# Patient Record
Sex: Male | Born: 1970 | Race: Asian | Hispanic: No | Marital: Married | Smoking: Never smoker
Health system: Southern US, Community
[De-identification: ages and names within clinical notes are randomized; demographics above are authoritative.]

## PROBLEM LIST (undated history)

## (undated) DIAGNOSIS — J309 Allergic rhinitis, unspecified: Secondary | ICD-10-CM

## (undated) DIAGNOSIS — D509 Iron deficiency anemia, unspecified: Secondary | ICD-10-CM

## (undated) DIAGNOSIS — E785 Hyperlipidemia, unspecified: Secondary | ICD-10-CM

## (undated) HISTORY — DX: Hyperlipidemia, unspecified: E78.5

## (undated) HISTORY — DX: Iron deficiency anemia, unspecified: D50.9

## (undated) HISTORY — PX: COLONOSCOPY: SHX174

## (undated) HISTORY — DX: Allergic rhinitis, unspecified: J30.9

---

## 2000-10-20 ENCOUNTER — Emergency Department (HOSPITAL_COMMUNITY): Admission: EM | Admit: 2000-10-20 | Discharge: 2000-10-20 | Payer: Self-pay | Admitting: Emergency Medicine

## 2012-01-02 ENCOUNTER — Ambulatory Visit (INDEPENDENT_AMBULATORY_CARE_PROVIDER_SITE_OTHER): Payer: BC Managed Care – PPO | Admitting: Physician Assistant

## 2012-01-02 VITALS — BP 131/85 | HR 79 | Temp 98.0°F | Resp 18 | Ht 64.5 in | Wt 162.0 lb

## 2012-01-02 DIAGNOSIS — J069 Acute upper respiratory infection, unspecified: Secondary | ICD-10-CM

## 2012-01-02 MED ORDER — AZITHROMYCIN 250 MG PO TABS: ORAL_TABLET | ORAL | Status: AC

## 2012-01-02 MED ORDER — PROMETHAZINE-DM 6.25-15 MG/5ML PO SYRP: 5.0000 mL | ORAL_SOLUTION | Freq: Every day | ORAL | Status: AC

## 2012-01-02 NOTE — Progress Notes (Signed)
  Subjective:    Patient ID: Jared Galvan, male    DOB: [birth year], 41 y.o.   MRN: [id number]  HPI Mr. Jared Galvan c/o URI symptoms for 1 week.  Cough getting worse.  ST, Head congestion, Cough.  No F/C. Has not taken anything for this.   Review of Systems  HENT: Positive for congestion, sore throat, rhinorrhea and sinus pressure.   Respiratory: Positive for cough.   Musculoskeletal: Positive for myalgias.  Neurological: Positive for headaches.       Objective:   Physical Exam  Constitutional: He appears well-developed and well-nourished.  HENT:  Right Ear: Tympanic membrane normal.  Left Ear: Tympanic membrane normal.  Nose: Mucosal edema and rhinorrhea present.  Mouth/Throat: Posterior oropharyngeal erythema present.  Cardiovascular: Normal rate and regular rhythm.   Pulmonary/Chest: Effort normal and breath sounds normal.  Lymphadenopathy:    He has no cervical adenopathy.          Assessment & Plan:  URI  Zpack, phenergan DM and Mucinex DM otc

## 2012-05-13 ENCOUNTER — Encounter: Payer: Self-pay | Admitting: Physician Assistant

## 2012-05-13 ENCOUNTER — Other Ambulatory Visit: Payer: Self-pay | Admitting: Physician Assistant

## 2012-05-13 ENCOUNTER — Ambulatory Visit (INDEPENDENT_AMBULATORY_CARE_PROVIDER_SITE_OTHER): Payer: BC Managed Care – PPO | Admitting: Physician Assistant

## 2012-05-13 VITALS — BP 122/82 | HR 67 | Temp 98.1°F | Resp 16 | Ht 64.5 in | Wt 155.0 lb

## 2012-05-13 DIAGNOSIS — Z0000 Encounter for general adult medical examination without abnormal findings: Secondary | ICD-10-CM

## 2012-05-13 LAB — CBC
HCT: 42.4 % (ref 39.0–52.0)
Hemoglobin: 15 g/dL (ref 13.0–17.0)
MCV: 72.1 fL — ABNORMAL LOW (ref 78.0–100.0)
RBC: 5.88 MIL/uL — ABNORMAL HIGH (ref 4.22–5.81)
WBC: 7.3 10*3/uL (ref 4.0–10.5)

## 2012-05-13 LAB — COMPREHENSIVE METABOLIC PANEL
ALT: 32 U/L (ref 0–53)
AST: 23 U/L (ref 0–37)
Albumin: 5 g/dL (ref 3.5–5.2)
Alkaline Phosphatase: 53 U/L (ref 39–117)
BUN: 17 mg/dL (ref 6–23)
Calcium: 10.1 mg/dL (ref 8.4–10.5)
Chloride: 103 mEq/L (ref 96–112)
Potassium: 4 mEq/L (ref 3.5–5.3)
Sodium: 139 mEq/L (ref 135–145)
Total Protein: 7.8 g/dL (ref 6.0–8.3)

## 2012-05-13 LAB — POCT UA - MICROSCOPIC ONLY
Bacteria, U Microscopic: NEGATIVE
Mucus, UA: NEGATIVE
Yeast, UA: NEGATIVE

## 2012-05-13 LAB — POCT URINALYSIS DIPSTICK
Bilirubin, UA: NEGATIVE
Blood, UA: NEGATIVE
Glucose, UA: NEGATIVE
Nitrite, UA: NEGATIVE

## 2012-05-13 LAB — LIPID PANEL
HDL: 41 mg/dL (ref >39)
LDL Cholesterol: 122 mg/dL — ABNORMAL HIGH (ref 0–99)

## 2012-05-13 NOTE — Progress Notes (Signed)
Patient ID: Jared Galvan MRN: [id number], DOB: Jan 24, 1971 41 y.o. Date of Encounter: 05/13/2012, 1:02 PM  Primary Physician: No primary provider on file.  Chief Complaint: Physical (CPE)  HPI: 41 y.o. y/o male with history noted below here for CPE. Doing well. No issues/complaints. From [place]. Has been in the [place] 18 years. Last went to [place] 5 years ago to visit his father. Planning to go there again the following year. Generally healthy. Eats a healthy diet. Does get some exercise in, but not as much as he used to as he is busier with work now. Mother passed away in [place] with uterine cancer. No other known family history of cancers. Lives with his wife and daughter, both healthy.   Review of Systems: Consitutional: No fever, chills, fatigue, night sweats, lymphadenopathy, or weight changes. Eyes: No visual changes, eye redness, or discharge. ENT/Mouth: Ears: No otalgia, tinnitus, hearing loss, discharge. Nose: No congestion, rhinorrhea, sinus pain, or epistaxis. Throat: No sore throat, post nasal drip, or teeth pain. Cardiovascular: No CP, palpitations, diaphoresis, DOE, edema, orthopnea, PND. Respiratory: No cough, hemoptysis, SOB, or wheezing. Gastrointestinal: No anorexia, dysphagia, reflux, pain, nausea, vomiting, hematemesis, diarrhea, constipation, BRBPR, or melena. Genitourinary: No dysuria, frequency, urgency, hematuria, incontinence, nocturia, decreased urinary stream, discharge, impotence, or testicular pain/masses. Musculoskeletal: No decreased ROM, myalgias, stiffness, joint swelling, or weakness. Skin: No rash, erythema, lesion changes, pain, warmth, jaundice, or pruritis. Neurological: No headache, dizziness, syncope, seizures, tremors, memory loss, coordination problems, or paresthesias. Psychological: No anxiety, depression, hallucinations, SI/HI. Endocrine: No fatigue, polydipsia, polyphagia, polyuria, or known diabetes. All other systems were reviewed and are otherwise  negative.  No past medical history on file.   No past surgical history on file.  Home Meds:  Prior to Admission medications   Not on File    Allergies: No Known Allergies  History   Social History  . Marital Status: Single    Spouse Name: N/A    Number of Children: N/A  . Years of Education: N/A   Occupational History  . Not on file.   Social History Main Topics  . Smoking status: Never Smoker   . Smokeless tobacco: Not on file  . Alcohol Use: Not on file  . Drug Use: Not on file  . Sexually Active: Not on file   Other Topics Concern  . Not on file   Social History Narrative  . No narrative on file    No family history on file.  Physical Exam: There were no vitals taken for this visit.  General: Well developed, well nourished, in no acute distress. HEENT: Normocephalic, atraumatic. Conjunctiva pink, sclera non-icteric. Pupils 2 mm constricting to 1 mm, round, regular, and equally reactive to light and accomodation. EOMI. Internal auditory canal clear. TMs with good cone of light and without pathology. Nasal mucosa pink. Nares are without discharge. No sinus tenderness. Oral mucosa pink. Dentition normal. Pharynx without exudate.   Neck: Supple. Trachea midline. No thyromegaly. Full ROM. No lymphadenopathy. Lungs: Clear to auscultation bilaterally without wheezes, rales, or rhonchi. Breathing is of normal effort and unlabored. Cardiovascular: RRR with S1 S2. No murmurs, rubs, or gallops appreciated. Distal pulses 2+ symmetrically. No carotid or abdominal bruits. Abdomen: Soft, non-tender, non-distended with normoactive bowel sounds. No hepatosplenomegaly or masses. No rebound/guarding. No CVA tenderness. Without hernias.  Genitourinary: Circumcised male. No penile lesions. Testes descended bilaterally, and smooth without tenderness or masses.  Musculoskeletal: Full range of motion and 5/5 strength throughout. Without swelling, atrophy, tenderness, crepitus, or warmth.  Extremities without clubbing, cyanosis, or edema. Calves supple. Skin: Warm and moist without erythema, ecchymosis, wounds, or rash. Neuro: A+Ox3. CN II-XII grossly intact. Moves all extremities spontaneously. Full sensation throughout. Normal gait. DTR 2+ throughout upper and lower extremities. Finger to nose intact. Psych:  Responds to questions appropriately with a normal affect.   Studies:  Results for orders placed in visit on 05/13/12  POCT UA - MICROSCOPIC ONLY      Component Value Range   WBC, Ur, HPF, POC 0-1     RBC, urine, microscopic neg     Bacteria, U Microscopic neg     Mucus, UA neg     Epithelial cells, urine per micros 0-1     Crystals, Ur, HPF, POC neg     Casts, Ur, LPF, POC neg     Yeast, UA neg    POCT URINALYSIS DIPSTICK      Component Value Range   Color, UA yellow     Clarity, UA clear     Glucose, UA neg     Bilirubin, UA neg     Ketones, UA neg     Spec Grav, UA >=1.030     Blood, UA neg     pH, UA 6.0     Protein, UA neg     Urobilinogen, UA 0.2     Nitrite, UA neg     Leukocytes, UA Negative      CBC, CMP, Lipid, TSH all pending. Patient is fasting. Assessment/Plan:  41 y.o. y/o male here for CPE -Healthy male physical -Await labs -Healthy diet and exercise -Follow up pending labs -Anticipatory guidance   Signed, Eula Listen, PA-C 05/13/2012 1:02 PM

## 2012-05-14 LAB — T3: T3, Total: 124.9 ng/dL (ref 80.0–204.0)

## 2012-05-14 LAB — T4, FREE: Free T4: 1.57 ng/dL (ref 0.80–1.80)

## 2012-11-20 ENCOUNTER — Ambulatory Visit (INDEPENDENT_AMBULATORY_CARE_PROVIDER_SITE_OTHER): Payer: BC Managed Care – PPO | Admitting: Physician Assistant

## 2012-11-20 VITALS — BP 121/78 | HR 76 | Temp 97.9°F | Resp 16 | Ht 65.0 in | Wt 158.0 lb

## 2012-11-20 DIAGNOSIS — J029 Acute pharyngitis, unspecified: Secondary | ICD-10-CM

## 2012-11-20 DIAGNOSIS — R059 Cough, unspecified: Secondary | ICD-10-CM

## 2012-11-20 DIAGNOSIS — J069 Acute upper respiratory infection, unspecified: Secondary | ICD-10-CM

## 2012-11-20 LAB — POCT RAPID STREP A (OFFICE): Rapid Strep A Screen: NEGATIVE

## 2012-11-20 MED ORDER — IPRATROPIUM BROMIDE 0.03 % NA SOLN: 2.0000 | Freq: Two times a day (BID) | NASAL | Status: DC

## 2012-11-20 MED ORDER — GUAIFENESIN ER 1200 MG PO TB12: 1.0000 | ORAL_TABLET | Freq: Two times a day (BID) | ORAL | Status: DC | PRN

## 2012-11-20 MED ORDER — BENZONATATE 100 MG PO CAPS: 100.0000 mg | ORAL_CAPSULE | Freq: Three times a day (TID) | ORAL | Status: DC | PRN

## 2012-11-20 NOTE — Progress Notes (Signed)
  Subjective:    Patient ID: Jared Galvan, male    DOB: Apr 22, 1971, 42 y.o.   MRN: [id number]  HPI This 42 y.o. male presents for evaluation of 3-4 days of respiratory symptoms.  Cough, runny nose (white discharge), sore throat.  No fever, chills, GI/GU symptoms.  Gets this every year in February.   History reviewed. No pertinent past medical history.  History reviewed. No pertinent past surgical history.  Prior to Admission medications   Not on File    No Known Allergies  History   Social History  . Marital Status: Married    Spouse Name: N/A    Number of Children: 1  . Years of Education: 12   Occupational History  . chemical    Social History Main Topics  . Smoking status: Never Smoker   . Smokeless tobacco: Never Used  . Alcohol Use: No  . Drug Use: No  . Sexually Active: Yes -- Male partner(s)   Other Topics Concern  . Not on file   Social History Narrative   From [place].  Came to the [place] in 1992. Lives with his wife and their daughter.  His brother also lives with them.    Family History  Problem Relation Age of Onset  . Cancer Mother 65    uterine     Review of Systems As above.    Objective:   Physical Exam  Blood pressure 121/78, pulse 76, temperature 97.9 F (36.6 C), resp. rate 16, height 5\' 5"  (1.651 m), weight 158 lb (71.668 kg), SpO2 97.00%. Body mass index is 26.29 kg/(m^2). Well-developed, well nourished [place] man who is awake, alert and oriented, in NAD. HEENT: [place]/AT, PERRL, EOMI.  Sclera and conjunctiva are clear.  EAC are patent, TMs are normal in appearance. Nasal mucosa is pink and moist. OP is clear. Neck: supple, non-tender, no lymphadenopathy, thyromegaly. Heart: RRR, no murmur Lungs: normal effort, CTA Extremities: no cyanosis, clubbing or edema. Skin: warm and dry without rash. Psychologic: good mood and appropriate affect, normal speech and behavior.   Results for orders placed in visit on 11/20/12  POCT RAPID STREP A  (OFFICE)      Result Value Range   Rapid Strep A Screen Negative  Negative       Assessment & Plan:  Viral URI with cough - Plan: ipratropium (ATROVENT) 0.03 % nasal spray, Guaifenesin (MUCINEX MAXIMUM STRENGTH) 1200 MG TB12  Acute pharyngitis - Plan: POCT rapid strep A  Cough - Plan: benzonatate (TESSALON) 100 MG capsule

## 2012-11-20 NOTE — Patient Instructions (Signed)
Get plenty of rest and drink at least 64 ounces of water daily. 

## 2013-01-14 ENCOUNTER — Ambulatory Visit (INDEPENDENT_AMBULATORY_CARE_PROVIDER_SITE_OTHER): Payer: BC Managed Care – PPO | Admitting: Physician Assistant

## 2013-01-14 VITALS — BP 130/90 | HR 82 | Temp 98.8°F | Resp 16 | Ht 65.0 in | Wt 163.6 lb

## 2013-01-14 DIAGNOSIS — J019 Acute sinusitis, unspecified: Secondary | ICD-10-CM

## 2013-01-14 DIAGNOSIS — J309 Allergic rhinitis, unspecified: Secondary | ICD-10-CM

## 2013-01-14 DIAGNOSIS — R05 Cough: Secondary | ICD-10-CM

## 2013-01-14 DIAGNOSIS — R0981 Nasal congestion: Secondary | ICD-10-CM

## 2013-01-14 DIAGNOSIS — J3489 Other specified disorders of nose and nasal sinuses: Secondary | ICD-10-CM

## 2013-01-14 DIAGNOSIS — R059 Cough, unspecified: Secondary | ICD-10-CM

## 2013-01-14 MED ORDER — AMOXICILLIN 875 MG PO TABS: 875.0000 mg | ORAL_TABLET | Freq: Two times a day (BID) | ORAL | Status: DC

## 2013-01-14 MED ORDER — BENZONATATE 100 MG PO CAPS: 100.0000 mg | ORAL_CAPSULE | Freq: Three times a day (TID) | ORAL | Status: DC | PRN

## 2013-01-14 NOTE — Progress Notes (Signed)
  Subjective:    Patient ID: Jared Galvan, male    DOB: March 22, 1971, 42 y.o.   MRN: [id number]  HPI 42 year old male presents with 1 week history of cough, nasal congestion, sore throat, left sided sinus pressure, and PND. Does have a history of environmental allergies this time every year.  Has been taking OTC Zyrtec x 2 doses but does not think it has helped much.  No history of prescription medications for his allergies.  Denies fever, chills, nausea, vomiting, headache, dizziness, otalgia, SOB, wheezing, or hemoptysis.  Cough is not productive.    Patient is otherwise healthy with no other concerns today.     Review of Systems  Constitutional: Negative for fever and chills.  HENT: Positive for congestion, sore throat, rhinorrhea and postnasal drip. Negative for ear pain, trouble swallowing and neck stiffness.   Respiratory: Positive for cough. Negative for shortness of breath and wheezing.   Cardiovascular: Negative for chest pain.  Gastrointestinal: Negative for nausea, vomiting and abdominal pain.  Neurological: Negative for dizziness and headaches.       Objective:   Physical Exam  Constitutional: He is oriented to person, place, and time. He appears well-developed and well-nourished.  HENT:  Head: Normocephalic and atraumatic.  Right Ear: Hearing, tympanic membrane, external ear and ear canal normal.  Left Ear: Hearing, tympanic membrane, external ear and ear canal normal.  Mouth/Throat: Uvula is midline, oropharynx is clear and moist and mucous membranes are normal. No oropharyngeal exudate (clear postnasal drainage), posterior oropharyngeal edema, posterior oropharyngeal erythema or tonsillar abscesses.  Eyes: Conjunctivae are normal.  Neck: Normal range of motion. Neck supple.  Cardiovascular: Normal rate, regular rhythm and normal heart sounds.   Pulmonary/Chest: Effort normal and breath sounds normal.  Lymphadenopathy:    He has no cervical adenopathy.  Neurological: He is  alert and oriented to person, place, and time.  Psychiatric: He has a normal mood and affect. His behavior is normal. Judgment and thought content normal.          Assessment & Plan:  Acute sinusitis, unspecified - Plan: amoxicillin (AMOXIL) 875 MG tablet  Cough - Plan: benzonatate (TESSALON) 100 MG capsule  Nasal congestion  Allergic rhinitis  Patient Instructions  Take Zyrtec daily in the morning. May need to continue this medication through until summer.  Benadryl 25-50 mg at bedtime.  Start Atrovent nasal spray twice daily (has leftover rx at home from last visit) Amoxicillin 875 mg twice daily x 7 days Follow up if symptoms worsen or fail to improve.

## 2013-01-14 NOTE — Patient Instructions (Signed)
Take Zyrtec daily in the morning  Benadryl 25-50 mg at bedtime.  Start Atrovent nasal spray twice daily Amoxicillin 875 mg twice daily x 7 days

## 2013-01-16 ENCOUNTER — Encounter: Payer: Self-pay | Admitting: Family Medicine

## 2013-07-26 ENCOUNTER — Ambulatory Visit (INDEPENDENT_AMBULATORY_CARE_PROVIDER_SITE_OTHER): Payer: BC Managed Care – PPO | Admitting: Family Medicine

## 2013-07-26 VITALS — BP 120/78 | HR 96 | Temp 98.3°F | Resp 18 | Ht 65.0 in | Wt 159.0 lb

## 2013-07-26 DIAGNOSIS — J209 Acute bronchitis, unspecified: Secondary | ICD-10-CM

## 2013-07-26 DIAGNOSIS — R05 Cough: Secondary | ICD-10-CM

## 2013-07-26 DIAGNOSIS — R059 Cough, unspecified: Secondary | ICD-10-CM

## 2013-07-26 MED ORDER — CEFDINIR 300 MG PO CAPS: 600.0000 mg | ORAL_CAPSULE | Freq: Every day | ORAL | Status: DC

## 2013-07-26 MED ORDER — BENZONATATE 100 MG PO CAPS: 100.0000 mg | ORAL_CAPSULE | Freq: Three times a day (TID) | ORAL | Status: DC | PRN

## 2013-07-26 MED ORDER — HYDROCODONE-HOMATROPINE 5-1.5 MG/5ML PO SYRP: 5.0000 mL | ORAL_SOLUTION | ORAL | Status: DC | PRN

## 2013-07-26 NOTE — Progress Notes (Signed)
Subjective:  42 year old [place] man who is here with a history of having been sick for a week with a respiratory tract infection. He's had a cough productive of yellow mucus which is continued to persist. His abdominal wall hurts from coughing. He coughs more at nighttime. He works as a Char[employer] fundraiser. He does not smoke he has been getting this several times over the last year.  Objective: TMs are normal. He does have a left otalgia when he coughs. His throat is clear. Neck supple without significant nodes. Chest is clear to auscultation. Heart regular without murmurs.  Assessment: Cough and acute bronchitis  Plan: Tessalon Hycodan for nighttime or weekend use Omnicef one twice daily for antibiotic  Return if worse

## 2013-07-26 NOTE — Patient Instructions (Signed)
Drink plenty of fluids and get enough rest  Take the antibiotic one twice daily, cefdenir  Take the cough syrup at bedtime. It will make you drowsy, but will also help the pain of your abdominal wall.  You can take the cough pills in the daytime when you do not want to be sleepy.

## 2013-10-05 ENCOUNTER — Encounter: Payer: Self-pay | Admitting: Family Medicine

## 2013-10-05 ENCOUNTER — Ambulatory Visit (INDEPENDENT_AMBULATORY_CARE_PROVIDER_SITE_OTHER): Payer: BC Managed Care – PPO | Admitting: Family Medicine

## 2013-10-05 VITALS — BP 130/80 | HR 94 | Temp 98.7°F | Resp 16 | Ht 65.0 in | Wt 161.0 lb

## 2013-10-05 DIAGNOSIS — J029 Acute pharyngitis, unspecified: Secondary | ICD-10-CM

## 2013-10-05 DIAGNOSIS — J209 Acute bronchitis, unspecified: Secondary | ICD-10-CM

## 2013-10-05 DIAGNOSIS — R05 Cough: Secondary | ICD-10-CM

## 2013-10-05 DIAGNOSIS — R059 Cough, unspecified: Secondary | ICD-10-CM

## 2013-10-05 LAB — POCT RAPID STREP A (OFFICE): Rapid Strep A Screen: NEGATIVE

## 2013-10-05 MED ORDER — FLUTICASONE PROPIONATE 50 MCG/ACT NA SUSP: 2.0000 | Freq: Every day | NASAL | Status: DC

## 2013-10-05 MED ORDER — HYDROCODONE-HOMATROPINE 5-1.5 MG/5ML PO SYRP: 5.0000 mL | ORAL_SOLUTION | Freq: Every evening | ORAL | Status: DC | PRN

## 2013-10-05 MED ORDER — AMOXICILLIN-POT CLAVULANATE 875-125 MG PO TABS: 1.0000 | ORAL_TABLET | Freq: Two times a day (BID) | ORAL | Status: DC

## 2013-10-05 MED ORDER — BENZONATATE 100 MG PO CAPS: 100.0000 mg | ORAL_CAPSULE | Freq: Three times a day (TID) | ORAL | Status: DC | PRN

## 2013-10-05 NOTE — Progress Notes (Signed)
Chief Complaint:  Chief Complaint  Patient presents with  . Cough    fever hurts in chest and back when pt coughs productive cough yellow phlegm symptoms x 1 week  . Sore Throat    HPI: Jared Galvan is a 43 y.o. male who is here for a  1 week history of productive yellow-white cough. When he cough it  hurts the right side of his chest and his lower back. He has been coughing up a yellowish product with no blood present with it. He has been having headaches due to coughing so much. He states that his coughs started about a week ago and he hasn't been feeling any better since. He has a sore throat and some pain upon swallowing. He has been having chills. He states that his ears feel like there is water inside of them. He took [place] but didn't feel any better so he stopped taking them 3 days ago. No NVD.Deneis abdominal pain, rash. He has a child under age 43. He is a nonsmoker. Deneis allergies or asthma. Has had flu vaccine , no msk aches  History reviewed. No pertinent past medical history. History reviewed. No pertinent past surgical history. History   Social History  . Marital Status: Married    Spouse Name: N/A    Number of Children: 1  . Years of Education: 12   Occupational History  . chemical    Social History Main Topics  . Smoking status: Never Smoker   . Smokeless tobacco: Never Used  . Alcohol Use: No  . Drug Use: No  . Sexual Activity: Yes    Partners: Female   Other Topics Concern  . None   Social History Narrative   From [place].  Came to the [place] in 1992. Lives with his wife and their daughter.  His brother also lives with them.   Family History  Problem Relation Age of Onset  . Cancer Mother 109    uterine   No Known Allergies Prior to Admission medications   Medication Sig Start Date End Date Taking? Authorizing Provider  benzonatate (TESSALON) 100 MG capsule Take 1-2 capsules (100-200 mg total) by mouth 3 (three) times daily as needed  for cough. 07/26/13  Yes Posey Boyer, MD  cefdinir (OMNICEF) 300 MG capsule Take 2 capsules (600 mg total) by mouth daily. 07/26/13   Posey Boyer, MD  HYDROcodone-homatropine [facility]) 5-1.5 MG/5ML syrup Take 5 mLs by mouth every 4 (four) hours as needed for cough. 07/26/13   Posey Boyer, MD     ROS: The patient denies fevers, chills, night sweats, unintentional weight loss, chest pain, palpitations, wheezing, dyspnea on exertion, nausea, vomiting, abdominal pain, dysuria, hematuria, melena, numbness, weakness, or tingling.   All other systems have been reviewed and were otherwise negative with the exception of those mentioned in the HPI and as above.    PHYSICAL EXAM: Filed Vitals:   10/05/13 0822  BP: 130/80  Pulse: 94  Temp: 98.7 F (37.1 C)  Resp: 16  Spo2 98% Filed Vitals:   10/05/13 0822  Height: 5\' 5"  (1.651 m)  Weight: 161 lb (73.029 kg)   Body mass index is 26.79 kg/(m^2).  General: Alert, no acute distress HEENT:  Normocephalic, atraumatic, oropharynx patent. EOMI, PERRLA, TM nl, no exudates, erythematous throat Cardiovascular:  Regular rate and rhythm, no rubs murmurs or gallops.  No Carotid bruits, radial pulse intact. No pedal edema.  Respiratory: Clear to auscultation  bilaterally.  No wheezes, rales, or rhonchi.  No cyanosis, no use of accessory musculature GI: No organomegaly, abdomen is soft and non-tender, positive bowel sounds.  No masses. Skin: No rashes. Neurologic: Facial musculature symmetric. Psychiatric: Patient is appropriate throughout our interaction. Lymphatic: No cervical lymphadenopathy Musculoskeletal: Gait intact.   LABS: Results for orders placed in visit on 10/05/13  POCT RAPID STREP A (OFFICE)      Result Value Range   Rapid Strep A Screen Negative  Negative     EKG/XRAY:   Primary read interpreted by Dr. Marin Comment at [facility].   ASSESSMENT/PLAN: Encounter Diagnoses  Name Primary?  . Acute pharyngitis   . Acute bronchitis Yes  .  Cough    Rx Hycodan, Tessalon perles, Augmentin and also Flonase  F/u prn  Gross sideeffects, risk and benefits, and alternatives of medications d/w patient. Patient is aware that all medications have potential sideeffects and we are unable to predict every sideeffect or drug-drug interaction that may occur.  LE, [place], DO 10/05/2013 6:35 PM

## 2013-10-05 NOTE — Patient Instructions (Signed)
Vim ph? qu?n (Bronchitis) Vim ph? qu?n l vim ???ng th? t? kh qu?n vo ph?i (ph? qu?n). Tnh tr?ng vim th??ng gy s?n sinh d?ch nh?y v d?n ??n [place]. N?u vim tr? nn n?ng h?n, n c th? gy ra kh th?. NGUYN NHN.  Vim ph? qu?n c th? do:   Nhi?m vi rt  Vi khu?n.  Khi thu?c l.  D? ?ng nguyn, ch?t gy  nhi?m v cc ch?t gy kch ?ng khc. D?U HI?U V TRI?U CH?NG  Tri?u ch?ng ph? bi?n nh?t c?a vim ph? qu?n l [place] th??ng xuyn ra d?ch nh?y. Cc tri?u ch?ng khc bao g?m:  S?t.  ?au nh?c ton thn.  T?c ng?c.  ?n l?nh.  Kh th?.  ?au h?ng. CH?N ?ON  Vim ph? qu?n th??ng ???c ch?n ?on thng qua b?nh s? v khm th?c th?. Nh?ng ki?m tra nh? ch?p X-[place]ve[place] ng?c ??i khi ???c th?c hi?n ?? lo?i tr? cc b?nh l khc.  ?I?U TR?  Qu v? c th? c?n trnh ti?p xc v?i t?t c? nh?ng g gy b?nh (v d? nh? khi thu?c). ?i khi c?n ph?i dng thu?c. Nh?ng thu?c ny c th? bao g?m:  Khng sinh. Thu?c khng sinh c th? ???c k ??n n?u tnh tr?ng b?nh do vi khu?n gy ra.  Thu?c gi?m [place]. Nh?ng thu?c ny c th? ???c k ??n ?? gi?m tri?u ch?ng [employer].  Thu?c d?ng ht. Nh?ng thu?c ny c th? ???c k ??n ?? gip khai thng ???ng th? v lm cho qu v? d? th? h?n.  Cc lo?i thu?c steroid. Nh?ng lo?i thu?c ny c th? ???c k ??n cho nh?ng ng??i b? vim ph? qu?n ti pht (m?n tnh) H??NG D?N CH?M [place] T?I NH  Ngh? ng?i th?t nhi?u.  U?ng ?? n??c ?? n??c ti?u lun trong Notarianni?c mu vng nh?t (tr? khi qu v? c b?nh l c?n ph?i h?n ch? u?ng n??c). T?ng l??ng n??c c th? gip lm l?ng ??m v s? ng?n ng?a m?t n??c.  Ch? s? d?ng thu?c khng c?n k ??n [place]?c thu?c c?n k ??n theo ch? d?n c?a chuyn gia ch?m [place] s?c kh?e.  Ch? dng thu?c khng sinh theo ch? d?n. B?o ??m vi?c qu v? dng h?t thu?c ngay c? khi qu v? b?t ??u c?m th?y ?? h?n.  Hessie Diener khi thu?c th? ??ng, cc ch?t gy kch ?ng, v lu?ng khi m?nh. Nh?ng th? ny lm vim ph? qu?n n?ng h?n. N?u qu v? ht thu?c, hy b? ht thu?c. Cn nh?c s? d?ng  k?o cao su c nicotine Wass?c mi?ng dn trn da ?? gip ki?m sot cc tri?u ch?ng sau cai. B? ht thu?c s? gip ph?i bnh ph?c nhanh h?n.  ?? m?t chi?c my t?o s??ng mt trong phng ng? vo ban ?m ?? lm ?m khng kh. Vi?c ny gip lm l?ng d?ch nh?y. Thay n??c trong my t?o s??ng hng ngy. Qu v? c?ng c th? cho n??c nng khi t?m vi hoa sen v ng?i trong phng t?m ?ng kn c?a trong 5-10 pht.  G?p chuyn gia ch?m [place] s?c kh?e ?? khm l?i theo ch? d?n.  R?a tay th??ng xuyn ?? trnh m?c l?i b?nh vim ph? qu?n Toppin?c ly b?nh cho ng??i khc. ?I KHM N?U: Cc tri?u ch?ng c?a qu v? khng gi?m b?t sau 1 tu?n ?i?u tr?Marland Kitchen  NGAY L?P T?C ?I KHM N?U:  C?n s?t c?a qu v? t?ng ln.  Qu v? b? ?n l?nh.  Qu v? b? ?au ng?c.  Qu v? b? kh  th? h?n.  Qu v? c ??m l?n mu.  Qu v? b? ng?t.  Qu v? b? chong vng.  Qu v? b? ?au ??u n?ng.  Qu v? nn nhi?u l?n. ??M B?O QU V?:   Hi?u r cc h??ng d?n ny.  S? theo di tnh tr?ng c?a mnh.  S? yu c?u tr? gip ngay l?p t?c n?u qu v? c?m th?y khng kh?e [place]ri?c th?y tr?m tr?ng h?n. Document Released: 09/11/2005 Document Revised: 07/02/2013 [facility] Patient Information 2014 [place], [place].

## 2015-09-13 ENCOUNTER — Other Ambulatory Visit: Payer: Self-pay | Admitting: Urgent Care

## 2015-09-13 ENCOUNTER — Ambulatory Visit (INDEPENDENT_AMBULATORY_CARE_PROVIDER_SITE_OTHER): Payer: BLUE CROSS/BLUE SHIELD

## 2015-09-13 ENCOUNTER — Ambulatory Visit (INDEPENDENT_AMBULATORY_CARE_PROVIDER_SITE_OTHER): Payer: BLUE CROSS/BLUE SHIELD | Admitting: Urgent Care

## 2015-09-13 VITALS — BP 116/64 | HR 82 | Temp 98.5°F | Resp 16 | Ht 65.0 in | Wt 164.4 lb

## 2015-09-13 DIAGNOSIS — R109 Unspecified abdominal pain: Secondary | ICD-10-CM

## 2015-09-13 DIAGNOSIS — D649 Anemia, unspecified: Secondary | ICD-10-CM

## 2015-09-13 LAB — POCT URINALYSIS DIP (MANUAL ENTRY)
BILIRUBIN UA: NEGATIVE
BILIRUBIN UA: NEGATIVE
Glucose, UA: NEGATIVE
LEUKOCYTES UA: NEGATIVE
Nitrite, UA: NEGATIVE
PROTEIN UA: NEGATIVE
RBC UA: NEGATIVE
SPEC GRAV UA: 1.025
Urobilinogen, UA: 1
pH, UA: 6.5

## 2015-09-13 LAB — POCT CBC
Granulocyte percent: 53.6 %G (ref 37–80)
HEMATOCRIT: 42.3 % — AB (ref 43.5–53.7)
Hemoglobin: 14.4 g/dL (ref 14.1–18.1)
LYMPH, POC: 3.5 — AB (ref 0.6–3.4)
MCH, POC: 25.8 pg — AB (ref 27–31.2)
MCHC: 33.9 g/dL (ref 31.8–35.4)
MCV: 76 fL — AB (ref 80–97)
MID (CBC): 0.5 (ref 0–0.9)
MPV: 6.3 fL (ref 0–99.8)
POC GRANULOCYTE: 4.6 (ref 2–6.9)
POC LYMPH %: 40.8 % (ref 10–50)
POC MID %: 5.6 %M (ref 0–12)
Platelet Count, POC: 231 10*3/uL (ref 142–424)
RBC: 5.57 M/uL (ref 4.69–6.13)
RDW, POC: 13.2 %
WBC: 8.5 10*3/uL (ref 4.6–10.2)

## 2015-09-13 LAB — COMPREHENSIVE METABOLIC PANEL
ALBUMIN: 4.4 g/dL (ref 3.6–5.1)
ALK PHOS: 55 U/L (ref 40–115)
ALT: 53 U/L — AB (ref 9–46)
AST: 30 U/L (ref 10–40)
BILIRUBIN TOTAL: 0.4 mg/dL (ref 0.2–1.2)
BUN: 20 mg/dL (ref 7–25)
CALCIUM: 9.6 mg/dL (ref 8.6–10.3)
CO2: 26 mmol/L (ref 20–31)
CREATININE: 0.8 mg/dL (ref 0.60–1.35)
Chloride: 104 mmol/L (ref 98–110)
Glucose, Bld: 88 mg/dL (ref 65–99)
Potassium: 4.3 mmol/L (ref 3.5–5.3)
SODIUM: 139 mmol/L (ref 135–146)
TOTAL PROTEIN: 7.1 g/dL (ref 6.1–8.1)

## 2015-09-13 LAB — POC MICROSCOPIC URINALYSIS (UMFC): Mucus: ABSENT

## 2015-09-13 LAB — FERRITIN: FERRITIN: 485 ng/mL — AB (ref 22–322)

## 2015-09-13 LAB — POCT GLYCOSYLATED HEMOGLOBIN (HGB A1C): Hemoglobin A1C: 5.6

## 2015-09-13 MED ORDER — CYCLOBENZAPRINE HCL 5 MG PO TABS: 5.0000 mg | ORAL_TABLET | Freq: Every day | ORAL | Status: DC

## 2015-09-13 MED ORDER — MELOXICAM 7.5 MG PO TABS: 7.5000 mg | ORAL_TABLET | Freq: Every day | ORAL | Status: DC

## 2015-09-13 NOTE — Patient Instructions (Signed)
Flank Pain Flank pain refers to pain that is located on the side of the body between the upper abdomen and the back. The pain may occur over a short period of time (acute) or may be long-term or reoccurring (chronic). It may be mild or severe. Flank pain can be caused by many things. CAUSES  Some of the more common causes of flank pain include:  Muscle strains.   Muscle spasms.   A disease of your spine (vertebral disk disease).   A lung infection (pneumonia).   Fluid around your lungs (pulmonary edema).   A kidney infection.   Kidney stones.   A very painful skin rash caused by the chickenpox virus (shingles).   Gallbladder disease.  [place] care will depend on the cause of your pain. In general,  Rest as directed by your caregiver.  Drink enough fluids to keep your urine clear or pale yellow.  Only take over-the-counter or prescription medicines as directed by your caregiver. Some medicines may help relieve the pain.  Tell your caregiver about any changes in your pain.  Follow up with your caregiver as directed. SEEK IMMEDIATE MEDICAL CARE IF:   Your pain is not controlled with medicine.   You have new or worsening symptoms.  Your pain increases.   You have abdominal pain.   You have shortness of breath.   You have persistent nausea or vomiting.   You have swelling in your abdomen.   You feel faint or pass out.   You have blood in your urine.  You have a fever or persistent symptoms for more than 2-3 days.  You have a fever and your symptoms suddenly get worse. MAKE SURE YOU:   Understand these instructions.  Will watch your condition.  Will get help right away if you are not doing well or get worse.   This information is not intended to replace advice given to you by your health care provider. Make sure you discuss any questions you have with your health care provider.   Document Released: 11/02/2005 Document  Revised: 06/05/2012 Document Reviewed: 04/25/2012 Elsevier Interactive Patient Education 2016 Elsevier Inc.    Abdominal Pain, Adult Many things can cause abdominal pain. Usually, abdominal pain is not caused by a disease and will improve without treatment. It can often be observed and treated at home. Your health care provider will do a physical exam and possibly order blood tests and X-rays to help determine the seriousness of your pain. However, in many cases, more time must pass before a clear cause of the pain can be found. Before that point, your health care provider may not know if you need more testing or further treatment. HOME CARE INSTRUCTIONS Monitor your abdominal pain for any changes. The following actions may help to alleviate any discomfort you are experiencing:  Only take over-the-counter or prescription medicines as directed by your health care provider.  Do not take laxatives unless directed to do so by your health care provider.  Try a clear liquid diet (broth, tea, or water) as directed by your health care provider. Slowly move to a bland diet as tolerated. SEEK MEDICAL CARE IF:  You have unexplained abdominal pain.  You have abdominal pain associated with nausea or diarrhea.  You have pain when you urinate or have a bowel movement.  You experience abdominal pain that wakes you in the night.  You have abdominal pain that is worsened or improved by eating food.  You have  abdominal pain that is worsened with eating fatty foods.  You have a fever. SEEK IMMEDIATE MEDICAL CARE IF:  Your pain does not go away within 2 hours.  You keep throwing up (vomiting).  Your pain is felt only in portions of the abdomen, such as the right side or the left lower portion of the abdomen.  You pass bloody or black tarry stools. MAKE SURE YOU:  Understand these instructions.  Will watch your condition.  Will get help right away if you are not doing well or get worse.     This information is not intended to replace advice given to you by your health care provider. Make sure you discuss any questions you have with your health care provider.   Document Released: 06/21/2005 Document Revised: 06/02/2015 Document Reviewed: 05/21/2013 Elsevier Interactive Patient Education [employer].

## 2015-09-13 NOTE — Progress Notes (Signed)
MRN: [id number] DOB: March 24, 1971  Subjective:   Jared Galvan is a 44 y.o. male presenting for chief complaint of Flank Pain  Reports 2 day history of left flank pain and left sided abdominal pain. Pain is sharp and intermittent, worse with valsalva maneuver's such as cough of rising from a sitting position. Has not tried any medications for relief. Patient cannot recall any inciting factors. Denies fever, n/v, dysuria, hematuria, cloudy urine, history of kidney stones. Denies bloody stools, constipation, diarrhea. Drinks a lot of sodas. Hydrates well every day. Eats mostly asian food.  [place] currently has no medications in their medication list. Also has No Known Allergies.  Mark  has no past medical history on file. Also  has no past surgical history on file.  Objective:   Vitals: BP 116/64 mmHg  Pulse 82  Temp(Src) 98.5 F (36.9 C) (Oral)  Resp 16  Ht 5\' 5"  (1.651 m)  Wt 164 lb 6.4 oz (74.571 kg)  BMI 27.36 kg/m2  SpO2 98%  Physical Exam  Constitutional: He is oriented to person, place, and time. He appears well-developed and well-nourished.  HENT:  Mouth/Throat: Oropharynx is clear and moist.  Eyes: No scleral icterus.  Cardiovascular: Normal rate, regular rhythm and intact distal pulses.  Exam reveals no gallop and no friction rub.   No murmur heard. Pulmonary/Chest: No respiratory distress. He has no wheezes. He has no rales.  Abdominal: Soft. Bowel sounds are normal. He exhibits no distension and no mass. There is tenderness (Left-sided, worse LLQ).  Neurological: He is alert and oriented to person, place, and time.  Skin: Skin is warm and dry. No rash noted. No erythema. No pallor.   Results for orders placed or performed in visit on 09/13/15 (from the past 24 hour(s))  POCT urinalysis dipstick     Status: None   Collection Time: 09/13/15  2:55 PM  Result Value Ref Range   Color, UA yellow yellow   Clarity, UA clear clear   Glucose, UA negative negative   Bilirubin, UA negative negative   Ketones, POC UA negative negative   Spec Grav, UA 1.025    Blood, UA negative negative   pH, UA 6.5    Protein Ur, POC negative negative   Urobilinogen, UA 1.0    Nitrite, UA Negative Negative   Leukocytes, UA Negative Negative  POCT Microscopic Urinalysis ([facility])     Status: Abnormal   Collection Time: 09/13/15  2:55 PM  Result Value Ref Range   WBC,UR,HPF,POC Few (A) None WBC/hpf   RBC,UR,HPF,POC None None RBC/hpf   Bacteria None None, Too numerous to count   Mucus Absent Absent   Epithelial Cells, UR Per Microscopy None None, Too numerous to count cells/hpf  POCT CBC     Status: Abnormal   Collection Time: 09/13/15  2:55 PM  Result Value Ref Range   WBC 8.5 4.6 - 10.2 K/uL   Lymph, poc 3.5 (A) 0.6 - 3.4   POC LYMPH PERCENT 40.8 10 - 50 %L   MID (cbc) 0.5 0 - 0.9   POC MID % 5.6 0 - 12 %M   POC Granulocyte 4.6 2 - 6.9   Granulocyte percent 53.6 37 - 80 %G   RBC 5.57 4.69 - 6.13 M/uL   Hemoglobin 14.4 14.1 - 18.1 g/dL   HCT, POC 42.3 (A) 43.5 - 53.7 %   MCV 76.0 (A) 80 - 97 fL   MCH, POC 25.8 (A) 27 - 31.2 pg   MCHC  33.9 31.8 - 35.4 g/dL   RDW, POC 13.2 %   Platelet Count, POC 231 142 - 424 K/uL   MPV 6.3 0 - 99.8 fL  POCT glycosylated hemoglobin (Hb A1C)     Status: None   Collection Time: 09/13/15  2:55 PM  Result Value Ref Range   Hemoglobin A1C 5.6    Assessment and Plan :   1. Left flank pain 2. Left sided abdominal pain - Labs pending, consider CT if no improvement. For now, manage conservatively with NSAID and Flexeril.  3. Anemia, unspecified - Labs pending, unclear source, follow up by phone with labs.  Jaynee Eagles, PA-C [facility] [phone] 09/13/2015 2:23 PM

## 2015-09-15 LAB — HEPATITIS C ANTIBODY: HCV AB: NEGATIVE

## 2015-09-15 LAB — VITAMIN B12: Vitamin B-12: 355 pg/mL (ref 211–911)

## 2015-09-15 LAB — FOLATE: Folate: 9.1 ng/mL

## 2015-09-15 LAB — IRON AND TIBC
%SAT: 18 % (ref 15–60)
IRON: 67 ug/dL (ref 50–180)
TIBC: 377 ug/dL (ref 250–425)
UIBC: 310 ug/dL (ref 125–400)

## 2015-09-15 LAB — HEPATITIS B SURFACE ANTIGEN: HEP B S AG: NEGATIVE

## 2015-10-07 ENCOUNTER — Encounter: Payer: Self-pay | Admitting: Urgent Care

## 2015-10-07 ENCOUNTER — Ambulatory Visit (INDEPENDENT_AMBULATORY_CARE_PROVIDER_SITE_OTHER): Payer: BLUE CROSS/BLUE SHIELD | Admitting: Urgent Care

## 2015-10-07 VITALS — BP 125/72 | HR 73 | Temp 98.5°F | Resp 16 | Ht 65.0 in | Wt 160.4 lb

## 2015-10-07 DIAGNOSIS — D649 Anemia, unspecified: Secondary | ICD-10-CM

## 2015-10-07 DIAGNOSIS — R109 Unspecified abdominal pain: Secondary | ICD-10-CM | POA: Diagnosis not present

## 2015-10-07 DIAGNOSIS — Z23 Encounter for immunization: Secondary | ICD-10-CM

## 2015-10-07 DIAGNOSIS — R7989 Other specified abnormal findings of blood chemistry: Secondary | ICD-10-CM | POA: Diagnosis not present

## 2015-10-07 DIAGNOSIS — D509 Iron deficiency anemia, unspecified: Secondary | ICD-10-CM | POA: Diagnosis not present

## 2015-10-07 DIAGNOSIS — R945 Abnormal results of liver function studies: Secondary | ICD-10-CM

## 2015-10-07 LAB — CBC
HEMATOCRIT: 43.5 % (ref 39.0–52.0)
Hemoglobin: 14.6 g/dL (ref 13.0–17.0)
MCH: 25.1 pg — ABNORMAL LOW (ref 26.0–34.0)
MCHC: 33.6 g/dL (ref 30.0–36.0)
MCV: 74.9 fL — AB (ref 78.0–100.0)
MPV: 8.6 fL (ref 8.6–12.4)
Platelets: 252 10*3/uL (ref 150–400)
RBC: 5.81 MIL/uL (ref 4.22–5.81)
RDW: 14.3 % (ref 11.5–15.5)
WBC: 7.4 10*3/uL (ref 4.0–10.5)

## 2015-10-07 LAB — HEPATIC FUNCTION PANEL
ALBUMIN: 4.7 g/dL (ref 3.6–5.1)
ALT: 50 U/L — ABNORMAL HIGH (ref 9–46)
AST: 28 U/L (ref 10–40)
Alkaline Phosphatase: 56 U/L (ref 40–115)
BILIRUBIN TOTAL: 0.6 mg/dL (ref 0.2–1.2)
Bilirubin, Direct: 0.1 mg/dL (ref <=0.2)
Indirect Bilirubin: 0.5 mg/dL (ref 0.2–1.2)
Total Protein: 7.2 g/dL (ref 6.1–8.1)

## 2015-10-07 NOTE — Progress Notes (Signed)
    MRN: [id number] DOB: 1971-07-29  Subjective:   Jared Galvan is a 45 y.o. male presenting for chief complaint of Follow-up  Patient was seen 09-12-2016 for left sided abdominal pain and flank pain. His labs showed elevated ferritin, slight microcytic anemia, slightly elevated ALT. Patient was treated clinically for possible renal stone with meloxicam and Flexeril. Patient reports that he had significant improvement after 3 days. He has since not had any fever, abdominal pain, flank pain, dysuria, hematuria, n/v, diarrhea, loose stools, constipation, dark tarry stools. He present today for recheck.  [place] has a current medication list which includes the following prescription(s): cyclobenzaprine and meloxicam. Also has No Known Allergies.  [place]  has no past medical history on file. Also  has no past surgical history on file.  Objective:   Vitals: BP 125/72 mmHg  Pulse 73  Temp(Src) 98.5 F (36.9 C) (Oral)  Resp 16  Ht 5\' 5"  (1.651 m)  Wt 160 lb 6.4 oz (72.757 kg)  BMI 26.69 kg/m2  SpO2 97%  Physical Exam  Constitutional: He is oriented to person, place, and time. He appears well-developed and well-nourished.  HENT:  Mouth/Throat: Oropharynx is clear and moist.  Eyes: No scleral icterus.  Cardiovascular: Normal rate, regular rhythm and intact distal pulses.  Exam reveals no gallop and no friction rub.   No murmur heard. Pulmonary/Chest: No respiratory distress. He has no wheezes. He has no rales.  Abdominal: Soft. Bowel sounds are normal. He exhibits no distension and no mass. There is no tenderness.  No CVA tenderness.  Neurological: He is alert and oriented to person, place, and time.  Skin: Skin is warm and dry. No rash noted. No erythema. No pallor.   Assessment and Plan :   1. Anemia, unspecified 2. Microcytic anemia 3. Elevated ferritin - Unclear etiology, discussed diagnosis of anemia with patient. Will repeat labs and add additional labs, patient will complete  Hemoccult. Consider referral to heme. Follow up by phone.  4. Abnormal LFTs - Recheck pending.  5. Left sided abdominal pain - Significantly improved, stop meloxicam and Flexeril  6. Need for prophylactic vaccination and inoculation against influenza - Flu Vaccine QUAD 36+ mos IM   Jaynee Eagles, PA-C [facility] [phone] 10/07/2015 3:11 PM

## 2015-10-07 NOTE — Patient Instructions (Addendum)
Anemia, Nonspecific Anemia is a condition in which the concentration of red blood cells or hemoglobin in the blood is below normal. Hemoglobin is a substance in red blood cells that carries oxygen to the tissues of the body. Anemia results in not enough oxygen reaching these tissues.  CAUSES  Common causes of anemia include:   Excessive bleeding. Bleeding may be internal or external. This includes excessive bleeding from periods (in women) or from the intestine.   Poor nutrition.   Chronic kidney, thyroid, and liver disease.  Bone marrow disorders that decrease red blood cell production.  Cancer and treatments for cancer.  HIV, AIDS, and their treatments.  Spleen problems that increase red blood cell destruction.  Blood disorders.  Excess destruction of red blood cells due to infection, medicines, and autoimmune disorders. SIGNS AND SYMPTOMS   Minor weakness.   Dizziness.   Headache.  Palpitations.   Shortness of breath, especially with exercise.   Paleness.  Cold sensitivity.  Indigestion.  Nausea.  Difficulty sleeping.  Difficulty concentrating. Symptoms may occur suddenly or they may develop slowly.  DIAGNOSIS  Additional blood tests are often needed. These help your health care provider determine the best treatment. Your health care provider will check your stool for blood and look for other causes of blood loss.  TREATMENT  Treatment varies depending on the cause of the anemia. Treatment can include:   Supplements of iron, vitamin B12, or folic acid.   Hormone medicines.   A blood transfusion. This may be needed if blood loss is severe.   Hospitalization. This may be needed if there is significant continual blood loss.   Dietary changes.  Spleen removal. HOME CARE INSTRUCTIONS Keep all follow-up appointments. It often takes many weeks to correct anemia, and having your health care provider check on your condition and your response to  treatment is very important. SEEK IMMEDIATE MEDICAL CARE IF:   You develop extreme weakness, shortness of breath, or chest pain.   You become dizzy or have trouble concentrating.  You develop heavy vaginal bleeding.   You develop a rash.   You have bloody or black, tarry stools.   You faint.   You vomit up blood.   You vomit repeatedly.   You have abdominal pain.  You have a fever or persistent symptoms for more than 2-3 days.   You have a fever and your symptoms suddenly get worse.   You are dehydrated.  MAKE SURE YOU:  Understand these instructions.  Will watch your condition.  Will get help right away if you are not doing well or get worse.   This information is not intended to replace advice given to you by your health care provider. Make sure you discuss any questions you have with your health care provider.   Document Released: 10/19/2004 Document Revised: 05/14/2013 Document Reviewed: 03/07/2013 Elsevier Interactive Patient Education 2016 Elsevier Inc.  

## 2015-10-08 LAB — FERRITIN: Ferritin: 500 ng/mL — ABNORMAL HIGH (ref 22–322)

## 2015-10-08 LAB — PATHOLOGIST SMEAR REVIEW

## 2015-10-08 LAB — SEDIMENTATION RATE: SED RATE: 4 mm/h (ref 0–15)

## 2015-10-10 LAB — POC HEMOCCULT BLD/STL (HOME/3-CARD/SCREEN)
CARD #3 DATE: 1142017
Card #1 Date: 1122017
Card #2 Date: 1132017
FECAL OCCULT BLD: POSITIVE
FECAL OCCULT BLD: POSITIVE
Fecal Occult Blood, POC: POSITIVE — AB

## 2015-10-10 NOTE — Addendum Note (Signed)
Addended by: Kem Boroughs D on: 10/10/2015 12:14 PM   Modules accepted: Orders

## 2015-10-12 ENCOUNTER — Telehealth: Payer: Self-pay | Admitting: Urgent Care

## 2015-10-12 DIAGNOSIS — R195 Other fecal abnormalities: Secondary | ICD-10-CM

## 2015-10-12 DIAGNOSIS — D509 Iron deficiency anemia, unspecified: Secondary | ICD-10-CM

## 2015-10-12 DIAGNOSIS — R7989 Other specified abnormal findings of blood chemistry: Secondary | ICD-10-CM

## 2015-10-12 NOTE — Telephone Encounter (Signed)
Patient notified by phone, okay to be referred to GI specialist.

## 2015-11-03 ENCOUNTER — Ambulatory Visit (INDEPENDENT_AMBULATORY_CARE_PROVIDER_SITE_OTHER): Payer: BLUE CROSS/BLUE SHIELD | Admitting: Urgent Care

## 2015-11-03 VITALS — BP 120/86 | HR 81 | Temp 98.4°F | Resp 16 | Ht 66.0 in | Wt 166.6 lb

## 2015-11-03 DIAGNOSIS — J309 Allergic rhinitis, unspecified: Secondary | ICD-10-CM

## 2015-11-03 DIAGNOSIS — R05 Cough: Secondary | ICD-10-CM

## 2015-11-03 DIAGNOSIS — R51 Headache: Secondary | ICD-10-CM

## 2015-11-03 DIAGNOSIS — R059 Cough, unspecified: Secondary | ICD-10-CM

## 2015-11-03 DIAGNOSIS — R0981 Nasal congestion: Secondary | ICD-10-CM | POA: Diagnosis not present

## 2015-11-03 DIAGNOSIS — R519 Headache, unspecified: Secondary | ICD-10-CM

## 2015-11-03 MED ORDER — CETIRIZINE HCL 10 MG PO TABS: 10.0000 mg | ORAL_TABLET | Freq: Every day | ORAL | Status: DC

## 2015-11-03 MED ORDER — HYDROCODONE-HOMATROPINE 5-1.5 MG/5ML PO SYRP: 5.0000 mL | ORAL_SOLUTION | Freq: Every evening | ORAL | Status: DC | PRN

## 2015-11-03 MED ORDER — AMOXICILLIN 875 MG PO TABS: 875.0000 mg | ORAL_TABLET | Freq: Two times a day (BID) | ORAL | Status: DC

## 2015-11-03 MED ORDER — PSEUDOEPHEDRINE HCL ER 120 MG PO TB12: 120.0000 mg | ORAL_TABLET | Freq: Two times a day (BID) | ORAL | Status: DC

## 2015-11-03 NOTE — Patient Instructions (Signed)
Allergic Rhinitis Allergic rhinitis is when the mucous membranes in the nose respond to allergens. Allergens are particles in the air that cause your body to have an allergic reaction. This causes you to release allergic antibodies. Through a chain of events, these eventually cause you to release histamine into the blood stream. Although meant to protect the body, it is this release of histamine that causes your discomfort, such as frequent sneezing, congestion, and an itchy, runny nose.  CAUSES Seasonal allergic rhinitis (hay fever) is caused by pollen allergens that may come from grasses, trees, and weeds. Year-round allergic rhinitis (perennial allergic rhinitis) is caused by allergens such as house dust mites, pet dander, and mold spores. SYMPTOMS  Nasal stuffiness (congestion).  Itchy, runny nose with sneezing and tearing of the eyes. DIAGNOSIS Your health care provider can help you determine the allergen or allergens that trigger your symptoms. If you and your health care provider are unable to determine the allergen, skin or blood testing may be used. Your health care provider will diagnose your condition after taking your health history and performing a physical exam. Your health care provider may assess you for other related conditions, such as asthma, pink eye, or an ear infection. TREATMENT Allergic rhinitis does not have a cure, but it can be controlled by:  Medicines that block allergy symptoms. These may include allergy shots, nasal sprays, and oral antihistamines.  Avoiding the allergen. Hay fever may often be treated with antihistamines in pill or nasal spray forms. Antihistamines block the effects of histamine. There are over-the-counter medicines that may help with nasal congestion and swelling around the eyes. Check with your health care provider before taking or giving this medicine. If avoiding the allergen or the medicine prescribed do not work, there are many new medicines  your health care provider can prescribe. Stronger medicine may be used if initial measures are ineffective. Desensitizing injections can be used if medicine and avoidance does not work. Desensitization is when a patient is given ongoing shots until the body becomes less sensitive to the allergen. Make sure you follow up with your health care provider if problems continue. HOME CARE INSTRUCTIONS It is not possible to completely avoid allergens, but you can reduce your symptoms by taking steps to limit your exposure to them. It helps to know exactly what you are allergic to so that you can avoid your specific triggers. SEEK MEDICAL CARE IF:  You have a fever.  You develop a cough that does not stop easily (persistent).  You have shortness of breath.  You start wheezing.  Symptoms interfere with normal daily activities.   This information is not intended to replace advice given to you by your health care provider. Make sure you discuss any questions you have with your health care provider.   Document Released: 06/06/2001 Document Revised: 10/02/2014 Document Reviewed: 05/19/2013 Elsevier Interactive Patient Education 2016 Elsevier Inc.  

## 2015-11-03 NOTE — Progress Notes (Signed)
    MRN: [id number] DOB: Feb 01, 1971  Subjective:   Jared Galvan is a 45 y.o. male presenting for chief complaint of Cough; Nasal Congestion; and Fever  Reports 1 week history of productive cough, congestion, sinus headache, ear popping, ear fullness, subjective fever. Has been trying NyQuil with minimal relief. Denies chest pain, shob, n/v, abdominal pain, ear pain, ear drainage. Denies history of allergies; however, he admits that this same problem occurs every year around this time Jan-Feb. Denies smoking cigarettes, alcohol use.   Jared Galvan has a current medication list which includes the following prescription(s): pseudoeph-doxylamine-[id number]-apap. Also has No Known Allergies.  Jared Galvan  has no past medical history on file. Also  has no past surgical history on file.  Objective:   Vitals: BP 120/86 mmHg  Pulse 81  Temp(Src) 98.4 F (36.9 C) (Oral)  Resp 16  Ht 5\' 6"  (1.676 m)  Wt 166 lb 9.6 oz (75.569 kg)  BMI 26.90 kg/m2  SpO2 96%  Physical Exam  Constitutional: He is oriented to person, place, and time. He appears well-developed and well-nourished.  HENT:  TM's intact bilaterally, no effusions or erythema. Nasal turbinates pink and moist, nasal passages patent. No sinus tenderness. Oropharynx clear, mucous membranes moist, dentition in good repair.  Eyes: Right eye exhibits no discharge. Left eye exhibits no discharge. No scleral icterus.  Neck: Normal range of motion. Neck supple.  Cardiovascular: Normal rate, regular rhythm and intact distal pulses.  Exam reveals no gallop and no friction rub.   No murmur heard. Pulmonary/Chest: No respiratory distress. He has no wheezes. He has no rales.  Lymphadenopathy:    He has no cervical adenopathy.  Neurological: He is alert and oriented to person, place, and time.  Skin: Skin is warm and dry.   Assessment and Plan :   1. Allergic rhinitis, unspecified allergic rhinitis type 2. Nasal congestion 3. Cough 4. Sinus headache - Patient  likely has allergies, possible viral uri. Patient to start Zyrtec, use Sudafed for congestion, Hycodan for cough. Fill script for amoxicillin in 1 week if no improvement.  Jaynee Eagles, PA-C [facility] [phone] 11/03/2015 4:53 PM

## 2015-12-13 ENCOUNTER — Ambulatory Visit (INDEPENDENT_AMBULATORY_CARE_PROVIDER_SITE_OTHER): Payer: BLUE CROSS/BLUE SHIELD | Admitting: Physician Assistant

## 2015-12-13 VITALS — BP 110/78 | HR 91 | Temp 98.2°F | Resp 14 | Ht 65.25 in | Wt 162.8 lb

## 2015-12-13 DIAGNOSIS — J069 Acute upper respiratory infection, unspecified: Secondary | ICD-10-CM | POA: Diagnosis not present

## 2015-12-13 DIAGNOSIS — R059 Cough, unspecified: Secondary | ICD-10-CM

## 2015-12-13 DIAGNOSIS — R05 Cough: Secondary | ICD-10-CM | POA: Diagnosis not present

## 2015-12-13 MED ORDER — HYDROCODONE-HOMATROPINE 5-1.5 MG/5ML PO SYRP: 5.0000 mL | ORAL_SOLUTION | Freq: Every evening | ORAL | Status: DC | PRN

## 2015-12-13 MED ORDER — FLUTICASONE PROPIONATE 50 MCG/ACT NA SUSP: 2.0000 | Freq: Every day | NASAL | Status: DC

## 2015-12-13 MED ORDER — GUAIFENESIN ER 1200 MG PO TB12: 1.0000 | ORAL_TABLET | Freq: Two times a day (BID) | ORAL | Status: DC | PRN

## 2015-12-13 NOTE — Progress Notes (Signed)
[facility] [address], [place] [address] [phone]  Date:  12/13/2015   Name:  Jared Galvan   DOB:  [birth year]   MRN:  [id number]  PCP:  No primary care provider on file.    History of Present Illness:  Jared Galvan is a 45 y.o. male patient who presents to [facility] for cc of cough for the last 3 days.    Coughing progressively worsened over the last 3 days.  Rhinorrhea and congestion.  Headache at the frontal sinus.  Non-productive cough.  He has subjective fever and chills.  He has no sob or dyspnea.  Feeling more fatigued and a bit winded with normal activities.  He has not taken any medications.   His wife is sick, with sxs resolving in 1 week.   There are no active problems to display for this patient.   History reviewed. No pertinent past medical history.  History reviewed. No pertinent past surgical history.   Social History  Substance Use Topics  . Smoking status: Never Smoker   . Smokeless tobacco: Never Used  . Alcohol Use: No    Family History  Problem Relation Age of Onset  . Cancer Mother 31    uterine    No Known Allergies  Medication list has been reviewed and updated.  Current Outpatient Prescriptions on File Prior to Visit  Medication Sig Dispense Refill  . cetirizine (ZYRTEC) 10 MG tablet Take 1 tablet (10 mg total) by mouth daily. (Patient not taking: Reported on 12/13/2015) 30 tablet 11  . HYDROcodone-homatropine (HYCODAN) 5-1.5 MG/5ML syrup Take 5 mLs by mouth at bedtime as needed. (Patient not taking: Reported on 12/13/2015) 120 mL 0   No current facility-administered medications on file prior to visit.    ROS ROS otherwise unremarkable unless listed above.   Physical Examination: BP 110/78 mmHg  Pulse 91  Temp(Src) 98.2 F (36.8 C) (Oral)  Resp 14  Ht 5' 5.25" (1.657 m)  Wt 162 lb 12.8 oz (73.846 kg)  BMI 26.90 kg/m2  SpO2 96% Ideal Body Weight: Weight in (lb) to have BMI = 25: 151.1  Physical Exam  Constitutional:  He is oriented to person, place, and time. He appears well-developed and well-nourished. No distress.  HENT:  Head: Atraumatic.  Right Ear: Tympanic membrane, external ear and ear canal normal.  Left Ear: Tympanic membrane, external ear and ear canal normal.  Nose: Mucosal edema and rhinorrhea present. Right sinus exhibits no maxillary sinus tenderness and no frontal sinus tenderness. Left sinus exhibits no maxillary sinus tenderness and no frontal sinus tenderness.  Mouth/Throat: No uvula swelling. No oropharyngeal exudate, posterior oropharyngeal edema or posterior oropharyngeal erythema.  Eyes: Conjunctivae, EOM and lids are normal. Pupils are equal, round, and reactive to light. Right eye exhibits normal extraocular motion. Left eye exhibits normal extraocular motion.  Neck: Trachea normal and full passive range of motion without pain. No edema and no erythema present.  Cardiovascular: Normal rate and regular rhythm.  Exam reveals no gallop and no friction rub.   No murmur heard. Pulmonary/Chest: Effort normal. No apnea. No respiratory distress. He has no decreased breath sounds. He has no wheezes. He has no rhonchi.  Lymphadenopathy:       Head (right side): No submental, no submandibular and no tonsillar adenopathy present.       Head (left side): No submental, no submandibular and no tonsillar adenopathy present.    He has no cervical adenopathy.  Neurological: He is  alert and oriented to person, place, and time.  Skin: Skin is warm and dry. He is not diaphoretic.  Psychiatric: He has a normal mood and affect. His behavior is normal.     Assessment and Plan: Jared Galvan is a 45 y.o. male who is here today for cc of cough, congestion, and sore throat. This appears viral.  Advised that we treat supportively.  He will call after 4 days if he continues to have symptoms.  Can then treat with zpak   Upper respiratory virus - Plan: Guaifenesin (MUCINEX MAXIMUM STRENGTH) 1200 MG TB12,  fluticasone (FLONASE) 50 MCG/ACT nasal spray, HYDROcodone-homatropine (HYCODAN) 5-1.5 MG/5ML syrup  Cough - Plan: HYDROcodone-homatropine (HYCODAN) 5-1.5 MG/5ML syrup  Jared Drape, PA-C [facility] 12/13/2015 9:35 AM

## 2015-12-13 NOTE — Patient Instructions (Addendum)
IF you received an x-ray today, you will receive an invoice from [facility]. Please contact [facility] at [phone] with questions or concerns regarding your invoice.   IF you received labwork today, you will receive an invoice from [employer]. Please contact [facility] at [phone] with questions or concerns regarding your invoice.   Our billing staff will not be able to assist you with questions regarding bills from these companies.  You will be contacted with the lab results as soon as they are available. The fastest way to get your results is to activate your My Chart account. Instructions are located on the last page of this paperwork. If you have not heard from [place] regarding the results in 2 weeks, please contact this office.    Please hydrate well with 64 oz of water each day, if not more.   Please use the medication as prescribed. You will contact me by phone if you are not feeling improvement in the next 4 days. I want you to also restart the zyrtec.  This will help your allergies for the season.   Upper Respiratory Infection, Adult Most upper respiratory infections (URIs) are a viral infection of the air passages leading to the lungs. A URI affects the nose, throat, and upper air passages. The most common type of URI is nasopharyngitis and is typically referred to as "the common cold." URIs run their course and usually go away on their own. Most of the time, a URI does not require medical attention, but sometimes a bacterial infection in the upper airways can follow a viral infection. This is called a secondary infection. Sinus and middle ear infections are common types of secondary upper respiratory infections. Bacterial pneumonia can also complicate a URI. A URI can worsen asthma and chronic obstructive pulmonary disease (COPD). Sometimes, these complications can require emergency medical care and may be life threatening.   CAUSES Almost all URIs are caused by viruses. A virus is a type of germ and can spread from one person to another.  RISKS FACTORS You may be at risk for a URI if:   You smoke.   You have chronic heart or lung disease.  You have a weakened defense (immune) system.   You are very young or very old.   You have nasal allergies or asthma.  You work in crowded or poorly ventilated areas.  You work in health care facilities or schools. SIGNS AND SYMPTOMS  Symptoms typically develop 2-3 days after you come in contact with a cold virus. Most viral URIs last 7-10 days. However, viral URIs from the influenza virus (flu virus) can last 14-18 days and are typically more severe. Symptoms may include:   Runny or stuffy (congested) nose.   Sneezing.   Cough.   Sore throat.   Headache.   Fatigue.   Fever.   Loss of appetite.   Pain in your forehead, behind your eyes, and over your cheekbones (sinus pain).  Muscle aches.  DIAGNOSIS  Your health care provider may diagnose a URI by:  Physical exam.  Tests to check that your symptoms are not due to another condition such as:  Strep throat.  Sinusitis.  Pneumonia.  Asthma. TREATMENT  A URI goes away on its own with time. It cannot be cured with medicines, but medicines may be prescribed or recommended to relieve symptoms. Medicines may help:  Reduce your fever.  Reduce your cough.  Relieve nasal congestion. HOME CARE INSTRUCTIONS  Take medicines only as directed by your health care provider.   Gargle warm saltwater or take cough drops to comfort your throat as directed by your health care provider.  Use a warm mist humidifier or inhale steam from a shower to increase air moisture. This may make it easier to breathe.  Drink enough fluid to keep your urine clear or pale yellow.   Eat soups and other clear broths and maintain good nutrition.   Rest as needed.   Return to work when your temperature  has returned to normal or as your health care provider advises. You may need to stay home longer to avoid infecting others. You can also use a face mask and careful hand washing to prevent spread of the virus.  Increase the usage of your inhaler if you have asthma.   Do not use any tobacco products, including cigarettes, chewing tobacco, or electronic cigarettes. If you need help quitting, ask your health care provider. PREVENTION  The best way to protect yourself from getting a cold is to practice good hygiene.   Avoid oral or hand contact with people with cold symptoms.   Wash your hands often if contact occurs.  There is no clear evidence that vitamin C, vitamin E, echinacea, or exercise reduces the chance of developing a cold. However, it is always recommended to get plenty of rest, exercise, and practice good nutrition.  SEEK MEDICAL CARE IF:   You are getting worse rather than better.   Your symptoms are not controlled by medicine.   You have chills.  You have worsening shortness of breath.  You have brown or red mucus.  You have yellow or brown nasal discharge.  You have pain in your face, especially when you bend forward.  You have a fever.  You have swollen neck glands.  You have pain while swallowing.  You have white areas in the back of your throat. SEEK IMMEDIATE MEDICAL CARE IF:   You have severe or persistent:  Headache.  Ear pain.  Sinus pain.  Chest pain.  You have chronic lung disease and any of the following:  Wheezing.  Prolonged cough.  Coughing up blood.  A change in your usual mucus.  You have a stiff neck.  You have changes in your:  Vision.  Hearing.  Thinking.  Mood. MAKE SURE YOU:   Understand these instructions.  Will watch your condition.  Will get help right away if you are not doing well or get worse.   This information is not intended to replace advice given to you by your health care provider. Make sure  you discuss any questions you have with your health care provider.   Document Released: 03/07/2001 Document Revised: 01/26/2015 Document Reviewed: 12/17/2013 Elsevier Interactive Patient Education [employer].

## 2016-02-10 ENCOUNTER — Ambulatory Visit (INDEPENDENT_AMBULATORY_CARE_PROVIDER_SITE_OTHER): Payer: BLUE CROSS/BLUE SHIELD | Admitting: Family Medicine

## 2016-02-10 VITALS — BP 104/66 | HR 86 | Temp 99.4°F | Resp 18 | Ht 65.25 in | Wt 167.0 lb

## 2016-02-10 DIAGNOSIS — J069 Acute upper respiratory infection, unspecified: Secondary | ICD-10-CM

## 2016-02-10 DIAGNOSIS — R05 Cough: Secondary | ICD-10-CM

## 2016-02-10 DIAGNOSIS — Z91048 Other nonmedicinal substance allergy status: Secondary | ICD-10-CM

## 2016-02-10 DIAGNOSIS — Z9109 Other allergy status, other than to drugs and biological substances: Secondary | ICD-10-CM

## 2016-02-10 DIAGNOSIS — R059 Cough, unspecified: Secondary | ICD-10-CM

## 2016-02-10 DIAGNOSIS — H109 Unspecified conjunctivitis: Secondary | ICD-10-CM

## 2016-02-10 MED ORDER — PREDNISONE 20 MG PO TABS: ORAL_TABLET | ORAL | Status: DC

## 2016-02-10 MED ORDER — HYDROCODONE-HOMATROPINE 5-1.5 MG/5ML PO SYRP: 5.0000 mL | ORAL_SOLUTION | Freq: Every evening | ORAL | Status: DC | PRN

## 2016-02-10 MED ORDER — TOBRAMYCIN 0.3 % OP SOLN: 1.0000 [drp] | OPHTHALMIC | Status: DC

## 2016-02-10 NOTE — Progress Notes (Signed)
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 He has had seasonal allergies for many years ago, and this year is no different.  Patient complains of itchy eyes and gotten red with some crusting around the eyelids.  He also has a cough and and pressure building up in his ears. He says the cough is productive of white phlegm without any blood. He has no shortness of breath or chest pain.  Patient denies fever, dizziness or headache.  Objective:BP 104/66 mmHg  Pulse 86  Temp(Src) 99.4 F (37.4 C) (Oral)  Resp 18  Ht 5' 5.25" (1.657 m)  Wt 167 lb (75.751 kg)  BMI 27.59 kg/m2  SpO2 96% HEENT: Mild retraction of TMs, mild erythema in the posterior pharynx, bilateral conjunctivitis with erythema and mild blepharitis. Chest: Clear to auscultation Heart: Regular no murmur Skin: Clear  Assessment:Environmental allergies - Plan: predniSONE (DELTASONE) 20 MG tablet  Bilateral conjunctivitis - Plan: tobramycin (TOBREX) 0.3 % ophthalmic solution  Cough - Plan: HYDROcodone-homatropine (HYCODAN) 5-1.5 MG/5ML syrup  Upper respiratory virus - Plan: HYDROcodone-homatropine (HYCODAN) 5-1.5 MG/5ML syrup  Signed, Carola Frost.D.

## 2016-02-10 NOTE — Patient Instructions (Signed)
     IF you received an x-ray today, you will receive an invoice from [facility]. Please contact [facility] at [phone] with questions or concerns regarding your invoice.   IF you received labwork today, you will receive an invoice from [facility]. Please contact [facility] at [phone] with questions or concerns regarding your invoice.   Our billing staff will not be able to assist you with questions regarding bills from these companies.  You will be contacted with the lab results as soon as they are available. The fastest way to get your results is to activate your My Chart account. Instructions are located on the last page of this paperwork. If you have not heard from us regarding the results in 2 weeks, please contact this office.      

## 2016-10-20 ENCOUNTER — Ambulatory Visit (INDEPENDENT_AMBULATORY_CARE_PROVIDER_SITE_OTHER): Payer: BLUE CROSS/BLUE SHIELD | Admitting: Physician Assistant

## 2016-10-20 ENCOUNTER — Ambulatory Visit (INDEPENDENT_AMBULATORY_CARE_PROVIDER_SITE_OTHER): Payer: BLUE CROSS/BLUE SHIELD

## 2016-10-20 VITALS — BP 120/62 | HR 102 | Temp 98.0°F | Resp 17 | Ht 62.25 in | Wt 158.0 lb

## 2016-10-20 DIAGNOSIS — B9789 Other viral agents as the cause of diseases classified elsewhere: Secondary | ICD-10-CM | POA: Diagnosis not present

## 2016-10-20 DIAGNOSIS — R000 Tachycardia, unspecified: Secondary | ICD-10-CM

## 2016-10-20 DIAGNOSIS — J069 Acute upper respiratory infection, unspecified: Secondary | ICD-10-CM | POA: Diagnosis not present

## 2016-10-20 DIAGNOSIS — R05 Cough: Secondary | ICD-10-CM | POA: Diagnosis not present

## 2016-10-20 MED ORDER — HYDROCODONE-HOMATROPINE 5-1.5 MG/5ML PO SYRP: 2.5000 mL | ORAL_SOLUTION | Freq: Two times a day (BID) | ORAL | 0 refills | Status: DC

## 2016-10-20 MED ORDER — NAPROXEN 500 MG PO TABS: 500.0000 mg | ORAL_TABLET | Freq: Two times a day (BID) | ORAL | 0 refills | Status: DC

## 2016-10-20 NOTE — Progress Notes (Signed)
  10/23/2016 9:40 AM   DOB: 1971/05/01 / MRN: [id number]  SUBJECTIVE:  Jared Galvan is a 46 y.o. male presenting for cough and runny nose and nasal congestion that started three days ago. He associates HA and sore throat. He denies myalgia. His sister is sick with similar symptoms.  He is not feeling better or worse. Denies SOB, chest pain, leg swelling.    Immunization History  Administered Date(s) Administered  . Influenza,inj,Quad PF,36+ Mos 10/07/2015   He has No Known Allergies.   He  has no past medical history on file.    He  reports that he has never smoked. He has never used smokeless tobacco. He reports that he does not drink alcohol or use drugs. He  reports that he currently engages in sexual activity and has had male partners. The patient  has no past surgical history on file.  His family history includes Cancer (age of onset: 21) in his mother.  Review of Systems  Constitutional: Negative for chills and fever.  Gastrointestinal: Negative for nausea.  Musculoskeletal: Negative for myalgias.  Skin: Negative for rash.  Neurological: Negative for dizziness.    The problem list and medications were reviewed and updated by myself where necessary and exist elsewhere in the encounter.   OBJECTIVE:  BP 120/62 (BP Location: Right Arm, Patient Position: Sitting, Cuff Size: Normal)   Pulse (!) 102   Temp 98 F (36.7 C) (Oral)   Resp 17   Ht 5' 2.25" (1.581 m)   Wt 158 lb (71.7 kg)   SpO2 97%   BMI 28.67 kg/m    Pulse Readings from Last 3 Encounters:  10/20/16 (!) 102  02/10/16 86  12/13/15 91    Physical Exam  Constitutional: He is oriented to person, place, and time. He appears well-developed and well-nourished.  HENT:  Mouth/Throat: Oropharynx is clear and moist.  Cardiovascular: Normal rate and regular rhythm.   Pulmonary/Chest: Effort normal and breath sounds normal. No respiratory distress. He has no wheezes. He has no rales. He exhibits no tenderness.   Musculoskeletal: Normal range of motion.  Neurological: He is alert and oriented to person, place, and time.    No results found for this or any previous visit (from the past 72 hour(s)).  No results found.  ASSESSMENT AND PLAN:  Zac was seen today for cough and nasal congestion.  Diagnoses and all orders for this visit:  Viral URI with cough: Rads reassuring. Symptomatic management.  -     HYDROcodone-homatropine (HYCODAN) 5-1.5 MG/5ML syrup; Take 2.5-5 mLs by mouth 2 (two) times daily. -     naproxen (NAPROSYN) 500 MG tablet; Take 1 tablet (500 mg total) by mouth 2 (two) times daily with a meal.  Tachycardia -     DG Chest 2 View; Future    The patient is advised to call or return to clinic if he does not see an improvement in symptoms, or to seek the care of the closest emergency department if he worsens with the above plan.   Philis Fendt, MHS, PA-C [facility] 10/23/2016 9:40 AM

## 2016-10-20 NOTE — Patient Instructions (Signed)
     IF you received an x-ray today, you will receive an invoice from [facility]. Please contact [facility] at [phone] with questions or concerns regarding your invoice.   IF you received labwork today, you will receive an invoice from [facility]. Please contact [facility] at [phone] with questions or concerns regarding your invoice.   Our billing staff will not be able to assist you with questions regarding bills from these companies.  You will be contacted with the lab results as soon as they are available. The fastest way to get your results is to activate your My Chart account. Instructions are located on the last page of this paperwork. If you have not heard from us regarding the results in 2 weeks, please contact this office.     

## 2016-10-25 ENCOUNTER — Ambulatory Visit (INDEPENDENT_AMBULATORY_CARE_PROVIDER_SITE_OTHER): Payer: BLUE CROSS/BLUE SHIELD | Admitting: Urgent Care

## 2016-10-25 VITALS — BP 110/62 | HR 86 | Temp 98.2°F | Resp 18 | Ht 62.25 in | Wt 160.8 lb

## 2016-10-25 DIAGNOSIS — H938X3 Other specified disorders of ear, bilateral: Secondary | ICD-10-CM | POA: Diagnosis not present

## 2016-10-25 DIAGNOSIS — J309 Allergic rhinitis, unspecified: Secondary | ICD-10-CM | POA: Diagnosis not present

## 2016-10-25 DIAGNOSIS — B9789 Other viral agents as the cause of diseases classified elsewhere: Secondary | ICD-10-CM | POA: Diagnosis not present

## 2016-10-25 DIAGNOSIS — J069 Acute upper respiratory infection, unspecified: Secondary | ICD-10-CM | POA: Diagnosis not present

## 2016-10-25 DIAGNOSIS — R490 Dysphonia: Secondary | ICD-10-CM

## 2016-10-25 MED ORDER — HYDROCOD POLST-CPM POLST ER 10-8 MG/5ML PO SUER: 5.0000 mL | Freq: Every evening | ORAL | 0 refills | Status: DC | PRN

## 2016-10-25 MED ORDER — BENZONATATE 100 MG PO CAPS: 100.0000 mg | ORAL_CAPSULE | Freq: Three times a day (TID) | ORAL | 0 refills | Status: DC | PRN

## 2016-10-25 MED ORDER — PSEUDOEPHEDRINE HCL ER 120 MG PO TB12: 120.0000 mg | ORAL_TABLET | Freq: Two times a day (BID) | ORAL | 3 refills | Status: DC

## 2016-10-25 MED ORDER — CETIRIZINE HCL 10 MG PO TABS: 10.0000 mg | ORAL_TABLET | Freq: Every day | ORAL | 11 refills | Status: DC

## 2016-10-25 MED ORDER — AZITHROMYCIN 250 MG PO TABS
ORAL_TABLET | ORAL | 0 refills | Status: DC
Start: 2016-10-25 — End: 2017-01-23

## 2016-10-25 NOTE — Progress Notes (Signed)
  MRN: [id number] DOB: [birth year]  Subjective:   Jared Galvan is a 46 y.o. male presenting for chief complaint of Follow-up (URI)  Reports 10 day history of persistent productive cough, hoarseness, right ear pressure and popping, right ear tinnitus, itchy eyes. Cough elicits chest pain. Has hydrated well. Has tried cough syrup, Anaprox without any improvement. Denies fever, eye pain, ear drainage, ear pain, sore throat, shob, wheezing, n/v, abdominal pain, rashes, body aches. Admits history of seasonal allergies in spring and summer. Denies history of asthma. Denies smoking cigarettes or drinking alcohol.   Talik has a current medication list which includes the following prescription(s): hydrocodone-homatropine and naproxen. Also has No Known Allergies.  Jovonnie has pmh of allergic rhinitis. Denies past surgical history.  Objective:   Vitals: BP 110/62   Pulse 86   Temp 98.2 F (36.8 C) (Oral)   Resp 18   Ht 5' 2.25" (1.581 m)   Wt 160 lb 12.8 oz (72.9 kg)   SpO2 96%   BMI 29.17 kg/m   Physical Exam  Constitutional: He is oriented to person, place, and time. He appears well-developed and well-nourished.  HENT:  TM's flat but intact bilaterally, no effusions or erythema. Nasal turbinates boggy and edematous, nasal passages patent. No sinus tenderness. Oropharynx with moderate post-nasal drainage, mucous membranes moist, dentition in good repair.  Eyes: Right eye exhibits no discharge. Left eye exhibits no discharge.  Neck: Normal range of motion. Neck supple.  Cardiovascular: Normal rate, regular rhythm and intact distal pulses.  Exam reveals no gallop and no friction rub.   No murmur heard. Pulmonary/Chest: No respiratory distress. He has no wheezes. He has no rales.  Lymphadenopathy:    He has no cervical adenopathy.  Neurological: He is alert and oriented to person, place, and time.  Skin: Skin is warm and dry.   Assessment and Plan :   1. Viral URI with cough 2.  Hoarseness 3. Popping of both ears - I suspect patient is at the tail end of viral URI with persistent cough secondary to his persistent allergies. I recommended supportive care, restart allergy medications as below. Switch cough syrup, add Tessalon capsules, voice rest. Patient provided with script for azithromycin to fill on Saturday if he still has no improvement. Patient verbalized understanding potential for adverse effects and is in agreement with treatment plan.  4. Allergic rhinitis, unspecified chronicity, unspecified seasonality, unspecified trigger - Restart Zyrtec, Singulair to address any allergy component.  Jaynee Eagles, PA-C [facility] [id number] 10/25/2016  10:17 AM

## 2016-10-25 NOTE — Patient Instructions (Addendum)
Hoarseness Hoarseness is any abnormal change in your voice.Hoarseness can make it difficult to speak. Your voice may sound raspy, breathy, or strained. Hoarseness is caused by a problem with the vocal cords. The vocal cords are two bands of tissue inside your voice box (larynx). When you speak, your vocal cords move back and forth to create sound. The surfaces of your vocal cords need to be smooth for your voice to sound clear. Swelling or lumps on the vocal cords can cause hoarseness. Common causes of vocal cord problems include:  Upper airway infection.  A long-term cough.  Straining or overusing your voice.  Smoking.  Allergies.  Vocal cord growths.  Stomach acids that flow up from your stomach and irritate your vocal cords (gastroesophageal reflux). Follow these instructions at home: Watch your condition for any changes. To ease any discomfort that you feel:  Rest your voice. Do not whisper. Whispering can cause muscle strain.  Do not speak in a loud or harsh voice that makes your hoarseness worse.  Do not use any tobacco products, including cigarettes, chewing tobacco, or electronic cigarettes. If you need help quitting, ask your health care provider.  Avoid secondhand smoke.  Do not eat foods that give you heartburn. Heartburn can make gastroesophageal reflux worse.  Do not drink coffee.  Do not drink alcohol.  Drink enough fluids to keep your urine clear or pale yellow.  Use a humidifier if the air in your home is dry. Contact a health care provider if:  You have hoarseness that lasts longer than 3 weeks.  You almost lose or completelylose your voice for longer than 3 days.  You have pain when you swallow or try to talk.  You feel a lump in your neck. Get help right away if:  You have trouble swallowing.  You feel as though you are choking when you swallow.  You cough up blood or vomit blood.  You have trouble breathing. This information is not  intended to replace advice given to you by your health care provider. Make sure you discuss any questions you have with your health care provider. Document Released: 08/25/2005 Document Revised: 02/17/2016 Document Reviewed: 09/02/2014 Elsevier Interactive Patient Education  2017 Elsevier Inc[employer]     Cough, Adult Coughing is a reflex that clears your throat and your airways. Coughing helps to heal and protect your lungs. It is normal to cough occasionally, but a cough that happens with other symptoms or lasts a long time may be a sign of a condition that needs treatment. A cough may last only 2-3 weeks (acute), or it may last longer than 8 weeks (chronic). What are the causes? Coughing is commonly caused by:  Breathing in substances that irritate your lungs.  A viral or bacterial respiratory infection.  Allergies.  Asthma.  Postnasal drip.  Smoking.  Acid backing up from the stomach into the esophagus (gastroesophageal reflux).  Certain medicines.  Chronic lung problems, including COPD (or rarely, lung cancer).  Other medical conditions such as heart failure. Follow these instructions at home: Pay attention to any changes in your symptoms. Take these actions to help with your discomfort:  Take medicines only as told by your health care provider.  If you were prescribed an antibiotic medicine, take it as told by your health care provider. Do not stop taking the antibiotic even if you start to feel better.  Talk with your health care provider before you take a cough suppressant medicine.  Drink enough fluid to keep  your urine clear or pale yellow.  If the air is dry, use a cold steam vaporizer or humidifier in your bedroom or your home to help loosen secretions.  Avoid anything that causes you to cough at work or at home.  If your cough is worse at night, try sleeping in a semi-upright position.  Avoid cigarette smoke. If you smoke, quit smoking. If you need help quitting,  ask your health care provider.  Avoid caffeine.  Avoid alcohol.  Rest as needed. Contact a health care provider if:  You have new symptoms.  You cough up pus.  Your cough does not get better after 2-3 weeks, or your cough gets worse.  You cannot control your cough with suppressant medicines and you are losing sleep.  You develop pain that is getting worse or pain that is not controlled with pain medicines.  You have a fever.  You have unexplained weight loss.  You have night sweats. Get help right away if:  You cough up blood.  You have difficulty breathing.  Your heartbeat is very fast. This information is not intended to replace advice given to you by your health care provider. Make sure you discuss any questions you have with your health care provider. Document Released: 03/10/2011 Document Revised: 02/17/2016 Document Reviewed: 11/18/2014 Elsevier Interactive Patient Education  2017 [employer].    IF you received an x-ray today, you will receive an invoice from [facility]. Please contact [facility] at [phone] with questions or concerns regarding your invoice.   IF you received labwork today, you will receive an invoice from [facility]. Please contact [facility] at [phone] with questions or concerns regarding your invoice.   Our billing staff will not be able to assist you with questions regarding bills from these companies.  You will be contacted with the lab results as soon as they are available. The fastest way to get your results is to activate your My Chart account. Instructions are located on the last page of this paperwork. If you have not heard from [place] regarding the results in 2 weeks, please contact this office.

## 2017-01-23 ENCOUNTER — Ambulatory Visit (INDEPENDENT_AMBULATORY_CARE_PROVIDER_SITE_OTHER): Payer: BLUE CROSS/BLUE SHIELD | Admitting: Urgent Care

## 2017-01-23 ENCOUNTER — Encounter: Payer: Self-pay | Admitting: Urgent Care

## 2017-01-23 VITALS — BP 119/74 | HR 96 | Temp 98.9°F | Resp 17 | Ht 62.25 in | Wt 161.0 lb

## 2017-01-23 DIAGNOSIS — R0981 Nasal congestion: Secondary | ICD-10-CM

## 2017-01-23 DIAGNOSIS — R05 Cough: Secondary | ICD-10-CM | POA: Diagnosis not present

## 2017-01-23 DIAGNOSIS — J018 Other acute sinusitis: Secondary | ICD-10-CM

## 2017-01-23 DIAGNOSIS — J3089 Other allergic rhinitis: Secondary | ICD-10-CM

## 2017-01-23 DIAGNOSIS — R059 Cough, unspecified: Secondary | ICD-10-CM

## 2017-01-23 DIAGNOSIS — H9313 Tinnitus, bilateral: Secondary | ICD-10-CM | POA: Diagnosis not present

## 2017-01-23 MED ORDER — CETIRIZINE HCL 10 MG PO TABS: 10.0000 mg | ORAL_TABLET | Freq: Every day | ORAL | 11 refills | Status: DC

## 2017-01-23 MED ORDER — BENZONATATE 100 MG PO CAPS: 100.0000 mg | ORAL_CAPSULE | Freq: Three times a day (TID) | ORAL | 0 refills | Status: DC | PRN

## 2017-01-23 MED ORDER — PSEUDOEPHEDRINE HCL ER 120 MG PO TB12: 120.0000 mg | ORAL_TABLET | Freq: Two times a day (BID) | ORAL | 3 refills | Status: DC

## 2017-01-23 MED ORDER — HYDROCOD POLST-CPM POLST ER 10-8 MG/5ML PO SUER: 5.0000 mL | Freq: Every evening | ORAL | 0 refills | Status: DC | PRN

## 2017-01-23 NOTE — Progress Notes (Signed)
  MRN: [id number] DOB: 01/11/1971  Subjective:   Jared Galvan is a 46 y.o. male presenting for chief complaint of Cough (onset 4-5 days); Nasal Congestion; Ear Fullness; and Headache  Reports 5 day history of nasal congestion, tinnitus, bilateral ear fullness, itchy and watery eyes, sinus headache, scratchy throat, productive cough. Has not tried medications for relief. He takes Zyrtec for allergies but has not taken in it this past week. Denies sinus pain, ear pain, ear drainage, sore throat, chest pain, shob, n/v, abdominal pain, rashes. Denies smoking cigarettes.  Izaya is not currently taking any medications.  Also has No Known Allergies.  Jeffey has pmh of allergies. Also denies past surgical history.  Objective:   Vitals: BP 119/74 (BP Location: Right Arm, Patient Position: Sitting, Cuff Size: Normal)   Pulse 96   Temp 98.9 F (37.2 C) (Oral)   Resp 17   Ht 5' 2.25" (1.581 m)   Wt 161 lb (73 kg)   SpO2 95%   BMI 29.21 kg/m   Physical Exam  Constitutional: He is oriented to person, place, and time. He appears well-developed and well-nourished.  HENT:  TM's intact bilaterally with mild effusions but not erythema, no tragus tenderness. Nasal turbinates pink, dry and edematous with thick copious mucus, nasal passages minimally patent. No sinus tenderness. Post-nasal drainage present, mucous membranes moist.  Eyes: Right eye exhibits no discharge. Left eye exhibits no discharge.  Neck: Normal range of motion. Neck supple.  Cardiovascular: Normal rate, regular rhythm and intact distal pulses.  Exam reveals no gallop and no friction rub.   No murmur heard. Pulmonary/Chest: No respiratory distress. He has no wheezes. He has no rales.  Lymphadenopathy:    He has no cervical adenopathy.  Neurological: He is alert and oriented to person, place, and time.  Skin: Skin is warm and dry.  Psychiatric: He has a normal mood and affect.    Assessment and Plan :   1. Non-seasonal allergic  rhinitis due to other allergic trigger 2. Tinnitus of both ears 3. Nasal congestion 4. Cough - Start Zyrtec, Sudafed. Use cough suppression medications. Start Flonase when symptoms are improved.  5. Other acute sinusitis, recurrence not specified - Will hold off on antibiotics for now given that patient has not tried any medications. RTC in 5 days if no improvement.  Jaynee Eagles, PA-C [facility] [phone] 01/23/2017  3:38 PM

## 2017-01-23 NOTE — Patient Instructions (Addendum)
Vim m?i d? ?ng (Allergic Rhinitis) Vim m?i d? ?ng l khi nim m?c m?i ph?n ?ng v?i cc d? ?ng nguyn. Cc d? ?ng nguyn l cc h?t nh? trong khng kh khi?n cho c? th? qu v? c ph?n ?ng d? ?ng. ?i?u ny khi?n cho c? th? qu v? gi?i phng ra cc khng th? d? ?ng. Jasmine December m?t chu?i cc s? ki?n, cu?i cng th chng khi?n cho c? th? qu v? gi?i phng histamine vo mu. M?c d vi?c gi?i phng histamine nh?m m?c ?ch b?o v? c? th?, nh?ng chnh ?i?u ny gy ra c?m gic kh ch?u cho qu v?, ch?ng h?n nh? h?t h?i th??ng xuyn, ngh?t m?i, s? m?i v ng?a m?i. NGUYN NHN Vim m?i d? ?ng theo ma (c?m m?o) do cc d? ?ng nguyn ph?n hoa, c th? c ngu?n g?c t? c?, cy c?i v c? d?i. Vim m?i d? ?ng quanh n?m (vim m?i d? ?ng kinh nin) do cc d? ?ng nguyn nh? b?i m?t trong nh, lng v?t nui v bo t? n?m m?c. TRI?U CH?NG  Ngh?t m?i (t?c ngh?n).  Ng?a m?i, s? m?i km v?i h?t h?i v ch?y n??c m?t. CH?N ?ON Chuyn gia ch?m [place] s?c kh?e c?a qu v? c th? gip qu v? xc ??nh d? ?ng nguyn [place]?c cc d? ?ng nguyn khi?n qu v? pht sinh cc tri?u ch?ng. N?u qu v? v chuyn gia ch?m [place] s?c kh?e c?a mnh khng th? xc ??nh ???c d? ?ng nguyn ?, th c th? lm xt nghi?m da [place]?c mu. Chuyn gia ch?m [place] s?c kh?e c?a qu v? s? ch?n ?on tnh tr?ng c?a qu v? sau khi khm th?c th? v h?i ti?n s? b?nh c?a qu v?. Chuyn gia ch?m [place] s?c kh?e c?a qu v? c th? ?nh gi cc tnh tr?ng khc c lin quan c?a qu v?, ch?ng h?n nh? ch?ng hen suy?n, ?au m?t ??, [place]?c nhi?m trng tai. ?I?U TR? Khng th? ch?a kh?i ???c b?nh vim m?i d? ?ng, nh?ng c th? ki?m sot b?nh ny b?ng:  Cc thu?c ch?n tri?u ch?ng d? ?ng. Nh?ng thu?c ny bao g?m chch ng?a d? ?ng, thu?c x?t m?i v cc thu?c khng histamine ???ng u?ng.  Trnh cc d? ?ng nguyn. C?m m?o th??ng ???c ?i?u tr? b?ng cc thu?c khng histamine d?ng vin [place]?c d?ng x?t m?i. Thu?c khng histamine gip ch?n tc ??ng c?a histamine. C nh?ng thu?c khng c?n k ??n c th? c tc d?ng  khi b? ngh?t m?i v s?ng n? quanh m?t. Hy h?i chuyn gia ch?m [place] s?c kh?e c?a qu v? tr??c khi dng [place]?c cho dng lo?i thu?c ny. N?u vi?c trnh ???c d? ?ng nguyn [place]?c cc thu?c ? k toa khng c tc d?ng, th chuyn gia ch?m [place] s?c kh?e c?a qu v? c th? k toa nhi?u lo?i thu?c m?i. C th? dng thu?c m?nh h?n n?u cc bi?n php ban ??u khng c tc d?ng. C th? tim thu?c gi?i m?n c?m n?u vi?c dng thu?c v bi?n php phng trnh khng c tc d?ng. Gi?i m?n c?m l khi b?nh nhn ???c tim thu?c th??ng xuyn cho ??n khi c? th? tr? nn t nh?y c?m v?i d? ?ng nguyn ?Marland Kitchen Hy b?o ??m vi?c qu v? ??n g?p chuyn gia ch?m [place] s?c kh?e ?? ti khm n?u v?n ?? v?n ti?p di?n. H??NG D?N CH?M [place] T?I NH Khng th? trnh hon ton cc d? ?ng nguyn, nh?ng qu v? c th? lm gi?m cc tri?u ch?ng b?ng cch th?c  th?c hi?n cc b??c ?? h?n ch? ti?p xc v?i cc d? ?ng nguyn ?. S? h?u ch khi bi?t chnh xc qu v? d? ?ng v?i th? g, ?? qu v? c th? trnh cc tc nhn gy d? ?ng ??c hi?u c?a mnh. ?I [facility] N?U:  Qu v? b? s?t.  Qu v? b? [place] khng kh?i (dai d?ng).  Qu v? b? kh th?.  Qu v? b?t ??u th? kh kh.  Cc tri?u ch?ng gy tr? ng?i cho cc [place]?t ??ng th??ng ngy.  Thng tin ny khng nh?m m?c ?ch thay th? cho l?i khuyn m chuyn gia ch?m sc s?c kh?e ni v?i qu v?. Hy b?o ??m qu v? ph?i th?o lu?n b?t k? v?n ?? g m qu v? c v?i chuyn gia ch?m sc s?c kh?e c?a qu v?. Document Released: 01/03/2016 Document Revised: 01/03/2016 Document Reviewed: 05/19/2013 Elsevier Interactive Patient Education  2017 Elsevier Inc.     IF you received an x-ray today, you will receive an invoice from [facility]. Please contact [facility] at [phone] with questions or concerns regarding your invoice.   IF you received labwork today, you will receive an invoice from [facility]. Please contact [facility] at [phone] with questions or concerns regarding your invoice.   Our billing staff will not  be able to assist you with questions regarding bills from these companies.  You will be contacted with the lab results as soon as they are available. The fastest way to get your results is to activate your My Chart account. Instructions are located on the last page of this paperwork. If you have not heard from us regarding the results in 2 weeks, please contact this office.      

## 2017-11-10 ENCOUNTER — Ambulatory Visit: Payer: BLUE CROSS/BLUE SHIELD | Admitting: Physician Assistant

## 2017-11-12 ENCOUNTER — Ambulatory Visit: Payer: BLUE CROSS/BLUE SHIELD | Admitting: Physician Assistant

## 2017-12-24 ENCOUNTER — Encounter: Payer: Self-pay | Admitting: Physician Assistant

## 2018-08-17 ENCOUNTER — Ambulatory Visit (INDEPENDENT_AMBULATORY_CARE_PROVIDER_SITE_OTHER): Payer: BLUE CROSS/BLUE SHIELD | Admitting: Family Medicine

## 2018-08-17 ENCOUNTER — Other Ambulatory Visit: Payer: Self-pay

## 2018-08-17 ENCOUNTER — Encounter: Payer: Self-pay | Admitting: Family Medicine

## 2018-08-17 VITALS — BP 114/73 | HR 90 | Temp 98.4°F | Resp 18 | Ht 62.25 in | Wt 167.6 lb

## 2018-08-17 DIAGNOSIS — R059 Cough, unspecified: Secondary | ICD-10-CM

## 2018-08-17 DIAGNOSIS — H9201 Otalgia, right ear: Secondary | ICD-10-CM

## 2018-08-17 DIAGNOSIS — R05 Cough: Secondary | ICD-10-CM | POA: Diagnosis not present

## 2018-08-17 DIAGNOSIS — J069 Acute upper respiratory infection, unspecified: Secondary | ICD-10-CM | POA: Diagnosis not present

## 2018-08-17 MED ORDER — BENZONATATE 100 MG PO CAPS: 100.0000 mg | ORAL_CAPSULE | Freq: Three times a day (TID) | ORAL | 0 refills | Status: DC | PRN

## 2018-08-17 MED ORDER — HYDROCOD POLST-CPM POLST ER 10-8 MG/5ML PO SUER: 5.0000 mL | Freq: Every evening | ORAL | 0 refills | Status: DC | PRN

## 2018-08-17 MED ORDER — AMOXICILLIN 875 MG PO TABS: 875.0000 mg | ORAL_TABLET | Freq: Two times a day (BID) | ORAL | 0 refills | Status: DC

## 2018-08-17 NOTE — Progress Notes (Signed)
Subjective:  By signing my name below, I, Moises Blood, attest that this documentation has been prepared under the direction and in the presence of Merri Ray, MD. Electronically Signed: Moises Blood, [place]. 08/17/2018 , 9:12 AM .  Patient was seen in Room 12 .   Patient ID: Jared Galvan, male    DOB: July 31, 1971, 47 y.o.   MRN: [id number] Chief Complaint  Patient presents with  . Cough    chest congestion and having some ear pain x5days   . Nasal Congestion   HPI Jared Galvan is a 47 y.o. male  Patient complains of cough that started about 5 days ago. He states having associated nasal congestion and sneezing as well. He informs the cough has been keeping him up at night with some chest wall soreness. He's also coughing up white phlegm. He also mentions having a little noise in his right ear with slight pain. He's been taking OTC nyquil. Sick contact from his daughter who had a similar cough, although she's feeling better. He denies fever or ear discharge. He denies history of lung disease or asthma. He was prescribed tussionex cough syrup and tessalon pearls last year; reports did well on it.   There are no active problems to display for this patient.  No past medical history on file. No past surgical history on file. No Known Allergies Prior to Admission medications   Medication Sig Start Date End Date Taking? Authorizing Provider  benzonatate (TESSALON) 100 MG capsule Take 1-2 capsules (100-200 mg total) by mouth 3 (three) times daily as needed. 01/23/17   Jaynee Eagles, PA-C  cetirizine (ZYRTEC) 10 MG tablet Take 1 tablet (10 mg total) by mouth daily. 01/23/17   Jaynee Eagles, PA-C  chlorpheniramine-HYDROcodone (TUSSIONEX PENNKINETIC ER) 10-8 MG/5ML SUER Take 5 mLs by mouth at bedtime as needed for cough. 01/23/17   Jaynee Eagles, PA-C  pseudoephedrine (SUDAFED 12 HOUR) 120 MG 12 hr tablet Take 1 tablet (120 mg total) by mouth 2 (two) times daily. 01/23/17   Jaynee Eagles, PA-C   Social History     Socioeconomic History  . Marital status: Married    Spouse name: Not on file  . Number of children: 1  . Years of education: 28  . Highest education level: Not on file  Occupational History  . Occupation: Banker  Social Needs  . Financial resource strain: Not on file  . Food insecurity:    Worry: Not on file    Inability: Not on file  . Transportation needs:    Medical: Not on file    Non-medical: Not on file  Tobacco Use  . Smoking status: Never Smoker  . Smokeless tobacco: Never Used  Substance and Sexual Activity  . Alcohol use: No  . Drug use: No  . Sexual activity: Yes    Partners: Female  Lifestyle  . Physical activity:    Days per week: Not on file    Minutes per session: Not on file  . Stress: Not on file  Relationships  . Social connections:    Talks on phone: Not on file    Gets together: Not on file    Attends religious service: Not on file    Active member of club or organization: Not on file    Attends meetings of clubs or organizations: Not on file    Relationship status: Not on file  . Intimate partner violence:    Fear of current or ex partner: Not on file  Emotionally abused: Not on file    Physically abused: Not on file    Forced sexual activity: Not on file  Other Topics Concern  . Not on file  Social History Narrative   From [place].  Came to the [place] in 1992. Lives with his wife and their daughter.  His brother also lives with them.    Review of Systems  Constitutional: Negative for fatigue, fever and unexpected weight change.  HENT: Positive for congestion, ear pain and rhinorrhea. Negative for ear discharge.   Eyes: Negative for visual disturbance.  Respiratory: Positive for cough. Negative for chest tightness and shortness of breath.   Cardiovascular: Negative for chest pain, palpitations and leg swelling.  Gastrointestinal: Negative for abdominal pain and blood in stool.  Neurological: Negative for dizziness, light-headedness and  headaches.       Objective:   Physical Exam  Constitutional: He is oriented to person, place, and time. He appears well-developed and well-nourished.  HENT:  Head: Normocephalic and atraumatic.  Right Ear: External ear and ear canal normal. Tympanic membrane is bulging.  Left Ear: Tympanic membrane, external ear and ear canal normal.  Nose: No rhinorrhea.  Mouth/Throat: Oropharynx is clear and moist and mucous membranes are normal. No oropharyngeal exudate or posterior oropharyngeal erythema.  Right ear: TM with minimal clear fluid, does appear bulged Left ear: TM pearly gray, canal is clear  Eyes: Pupils are equal, round, and reactive to light. Conjunctivae are normal.  Neck: Neck supple.  Cardiovascular: Normal rate, regular rhythm, normal heart sounds and intact distal pulses.  No murmur heard. Pulmonary/Chest: Effort normal and breath sounds normal. He has no wheezes. He has no rhonchi. He has no rales.  Abdominal: Soft. There is no tenderness.  Lymphadenopathy:    He has no cervical adenopathy.  Neurological: He is alert and oriented to person, place, and time.  Skin: Skin is warm and dry. No rash noted.  Psychiatric: He has a normal mood and affect. His behavior is normal.  Vitals reviewed.   Vitals:   08/17/18 0845  BP: 114/73  Pulse: 90  Resp: 18  Temp: 98.4 F (36.9 C)  TempSrc: Oral  SpO2: 94%  Weight: 167 lb 9.6 oz (76 kg)  Height: 5' 2.25" (1.581 m)      Assessment & Plan:    Jared Galvan is a 47 y.o. male Acute upper respiratory infection  Cough - Plan: benzonatate (TESSALON) 100 MG capsule, chlorpheniramine-HYDROcodone (TUSSIONEX PENNKINETIC ER) 10-8 MG/5ML SUER  Otalgia of right ear - Plan: amoxicillin (AMOXIL) 875 MG tablet  Suspected viral upper respiratory infection with cough.  Overall reassuring exam.    - Right ear with some effusion, no significant erythema.  Does not appear to be infected at this time, but amoxicillin prescribed if worsening  pain and potential side effects discussed.   -  Tessalon or Mucinex over-the-counter for cough during the day, hydrocodone cough syrup at night.  Side effects and risk discussed.  RTC precautions given  Meds ordered this encounter  Medications  . benzonatate (TESSALON) 100 MG capsule    Sig: Take 1-2 capsules (100-200 mg total) by mouth 3 (three) times daily as needed.    Dispense:  30 capsule    Refill:  0  . chlorpheniramine-HYDROcodone (TUSSIONEX PENNKINETIC ER) 10-8 MG/5ML SUER    Sig: Take 5 mLs by mouth at bedtime as needed for cough.    Dispense:  100 mL    Refill:  0  . amoxicillin (AMOXIL) 875 MG  tablet    Sig: Take 1 tablet (875 mg total) by mouth 2 (two) times daily.    Dispense:  20 tablet    Refill:  0   Patient Instructions     Cough is likely due to an upper respiratory infection from a virus at this time.  Although there is some fluid behind the right ear it does not look infected at this time.  If any worsening ear pain, or fever, start antibiotic.    For cough can use Mucinex over-the-counter or Tessalon Perles during the day, hydrocodone cough syrup if needed at night.  Make sure to drink plenty of fluids and rest. Return to the clinic or go to the nearest emergency room if any of your symptoms worsen or new symptoms occur.    Upper Respiratory Infection, Adult Most upper respiratory infections (URIs) are caused by a virus. A URI affects the nose, throat, and upper air passages. The most common type of URI is often called "the common cold." Follow these instructions at home:  Take medicines only as told by your doctor.  Gargle warm saltwater or take cough drops to comfort your throat as told by your doctor.  Use a warm mist humidifier or inhale steam from a shower to increase air moisture. This may make it easier to breathe.  Drink enough fluid to keep your pee (urine) clear or pale yellow.  Eat soups and other clear broths.  Have a healthy diet.  Rest  as needed.  Go back to work when your fever is gone or your doctor says it is okay. ? You may need to stay home longer to avoid giving your URI to others. ? You can also wear a face mask and wash your hands often to prevent spread of the virus.  Use your inhaler more if you have asthma.  Do not use any tobacco products, including cigarettes, chewing tobacco, or electronic cigarettes. If you need help quitting, ask your doctor. Contact a doctor if:  You are getting worse, not better.  Your symptoms are not helped by medicine.  You have chills.  You are getting more short of breath.  You have brown or red mucus.  You have yellow or brown discharge from your nose.  You have pain in your face, especially when you bend forward.  You have a fever.  You have puffy (swollen) neck glands.  You have pain while swallowing.  You have white areas in the back of your throat. Get help right away if:  You have very bad or constant: ? Headache. ? Ear pain. ? Pain in your forehead, behind your eyes, and over your cheekbones (sinus pain). ? Chest pain.  You have long-lasting (chronic) lung disease and any of the following: ? Wheezing. ? Long-lasting cough. ? Coughing up blood. ? A change in your usual mucus.  You have a stiff neck.  You have changes in your: ? Vision. ? Hearing. ? Thinking. ? Mood. This information is not intended to replace advice given to you by your health care provider. Make sure you discuss any questions you have with your health care provider. Document Released: 02/28/2008 Document Revised: 05/14/2016 Document Reviewed: 12/17/2013 Elsevier Interactive Patient Education  2018 [place].  Cough, Adult Coughing is a reflex that clears your throat and your airways. Coughing helps to heal and protect your lungs. It is normal to cough occasionally, but a cough that happens with other symptoms or lasts a long time may be a  sign of a condition that needs  treatment. A cough may last only 2-3 weeks (acute), or it may last longer than 8 weeks (chronic). What are the causes? Coughing is commonly caused by:  Breathing in substances that irritate your lungs.  A viral or bacterial respiratory infection.  Allergies.  Asthma.  Postnasal drip.  Smoking.  Acid backing up from the stomach into the esophagus (gastroesophageal reflux).  Certain medicines.  Chronic lung problems, including COPD (or rarely, lung cancer).  Other medical conditions such as heart failure.  Follow these instructions at home: Pay attention to any changes in your symptoms. Take these actions to help with your discomfort:  Take medicines only as told by your health care provider. ? If you were prescribed an antibiotic medicine, take it as told by your health care provider. Do not stop taking the antibiotic even if you start to feel better. ? Talk with your health care provider before you take a cough suppressant medicine.  Drink enough fluid to keep your urine clear or pale yellow.  If the air is dry, use a cold steam vaporizer or humidifier in your bedroom or your home to help loosen secretions.  Avoid anything that causes you to cough at work or at home.  If your cough is worse at night, try sleeping in a semi-upright position.  Avoid cigarette smoke. If you smoke, quit smoking. If you need help quitting, ask your health care provider.  Avoid caffeine.  Avoid alcohol.  Rest as needed.  Contact a health care provider if:  You have new symptoms.  You cough up pus.  Your cough does not get better after 2-3 weeks, or your cough gets worse.  You cannot control your cough with suppressant medicines and you are losing sleep.  You develop pain that is getting worse or pain that is not controlled with pain medicines.  You have a fever.  You have unexplained weight loss.  You have night sweats. Get help right away if:  You cough up blood.  You  have difficulty breathing.  Your heartbeat is very fast. This information is not intended to replace advice given to you by your health care provider. Make sure you discuss any questions you have with your health care provider. Document Released: 03/10/2011 Document Revised: 02/17/2016 Document Reviewed: 11/18/2014 Elsevier Interactive Patient Education  [employer].   If you have lab work done today you will be contacted with your lab results within the next 2 weeks.  If you have not heard from [place] then please contact us. The fastest way to get your results is to register for My Chart.   IF you received an x-ray today, you will receive an invoice from [facility]. Please contact [facility] at [phone] with questions or concerns regarding your invoice.   IF you received labwork today, you will receive an invoice from [place]. Please contact [facility] at [phone] with questions or concerns regarding your invoice.   Our billing staff will not be able to assist you with questions regarding bills from these companies.  You will be contacted with the lab results as soon as they are available. The fastest way to get your results is to activate your My Chart account. Instructions are located on the last page of this paperwork. If you have not heard from [place] regarding the results in 2 weeks, please contact this office.      I personally performed the services described in this documentation, which was scribed  in my presence. The recorded information has been reviewed and considered for accuracy and completeness, addended by me as needed, and agree with information above.  Signed,   Merri Ray, MD [facility].  08/17/18 9:19 AM

## 2018-08-17 NOTE — Patient Instructions (Addendum)
Cough is likely due to an upper respiratory infection from a virus at this time.  Although there is some fluid behind the right ear it does not look infected at this time.  If any worsening ear pain, or fever, start antibiotic.    For cough can use Mucinex over-the-counter or Tessalon Perles during the day, hydrocodone cough syrup if needed at night.  Make sure to drink plenty of fluids and rest. Return to the clinic or go to the nearest emergency room if any of your symptoms worsen or new symptoms occur.    Upper Respiratory Infection, Adult Most upper respiratory infections (URIs) are caused by a virus. A URI affects the nose, throat, and upper air passages. The most common type of URI is often called "the common cold." Follow these instructions at home:  Take medicines only as told by your doctor.  Gargle warm saltwater or take cough drops to comfort your throat as told by your doctor.  Use a warm mist humidifier or inhale steam from a shower to increase air moisture. This may make it easier to breathe.  Drink enough fluid to keep your pee (urine) clear or pale yellow.  Eat soups and other clear broths.  Have a healthy diet.  Rest as needed.  Go back to work when your fever is gone or your doctor says it is okay. ? You may need to stay home longer to avoid giving your URI to others. ? You can also wear a face mask and wash your hands often to prevent spread of the virus.  Use your inhaler more if you have asthma.  Do not use any tobacco products, including cigarettes, chewing tobacco, or electronic cigarettes. If you need help quitting, ask your doctor. Contact a doctor if:  You are getting worse, not better.  Your symptoms are not helped by medicine.  You have chills.  You are getting more short of breath.  You have brown or red mucus.  You have yellow or brown discharge from your nose.  You have pain in your face, especially when you bend forward.  You have a  fever.  You have puffy (swollen) neck glands.  You have pain while swallowing.  You have white areas in the back of your throat. Get help right away if:  You have very bad or constant: ? Headache. ? Ear pain. ? Pain in your forehead, behind your eyes, and over your cheekbones (sinus pain). ? Chest pain.  You have long-lasting (chronic) lung disease and any of the following: ? Wheezing. ? Long-lasting cough. ? Coughing up blood. ? A change in your usual mucus.  You have a stiff neck.  You have changes in your: ? Vision. ? Hearing. ? Thinking. ? Mood. This information is not intended to replace advice given to you by your health care provider. Make sure you discuss any questions you have with your health care provider. Document Released: 02/28/2008 Document Revised: 05/14/2016 Document Reviewed: 12/17/2013 Elsevier Interactive Patient Education  2018 [place].  Cough, Adult Coughing is a reflex that clears your throat and your airways. Coughing helps to heal and protect your lungs. It is normal to cough occasionally, but a cough that happens with other symptoms or lasts a long time may be a sign of a condition that needs treatment. A cough may last only 2-3 weeks (acute), or it may last longer than 8 weeks (chronic). What are the causes? Coughing is commonly caused by:  Breathing in substances  that irritate your lungs.  A viral or bacterial respiratory infection.  Allergies.  Asthma.  Postnasal drip.  Smoking.  Acid backing up from the stomach into the esophagus (gastroesophageal reflux).  Certain medicines.  Chronic lung problems, including COPD (or rarely, lung cancer).  Other medical conditions such as heart failure.  Follow these instructions at home: Pay attention to any changes in your symptoms. Take these actions to help with your discomfort:  Take medicines only as told by your health care provider. ? If you were prescribed an antibiotic  medicine, take it as told by your health care provider. Do not stop taking the antibiotic even if you start to feel better. ? Talk with your health care provider before you take a cough suppressant medicine.  Drink enough fluid to keep your urine clear or pale yellow.  If the air is dry, use a cold steam vaporizer or humidifier in your bedroom or your home to help loosen secretions.  Avoid anything that causes you to cough at work or at home.  If your cough is worse at night, try sleeping in a semi-upright position.  Avoid cigarette smoke. If you smoke, quit smoking. If you need help quitting, ask your health care provider.  Avoid caffeine.  Avoid alcohol.  Rest as needed.  Contact a health care provider if:  You have new symptoms.  You cough up pus.  Your cough does not get better after 2-3 weeks, or your cough gets worse.  You cannot control your cough with suppressant medicines and you are losing sleep.  You develop pain that is getting worse or pain that is not controlled with pain medicines.  You have a fever.  You have unexplained weight loss.  You have night sweats. Get help right away if:  You cough up blood.  You have difficulty breathing.  Your heartbeat is very fast. This information is not intended to replace advice given to you by your health care provider. Make sure you discuss any questions you have with your health care provider. Document Released: 03/10/2011 Document Revised: 02/17/2016 Document Reviewed: 11/18/2014 Elsevier Interactive Patient Education  [employer].   If you have lab work done today you will be contacted with your lab results within the next 2 weeks.  If you have not heard from [place] then please contact us. The fastest way to get your results is to register for My Chart.   IF you received an x-ray today, you will receive an invoice from [facility]. Please contact [facility] at [phone] with questions or  concerns regarding your invoice.   IF you received labwork today, you will receive an invoice from [place]. Please contact [facility] at [phone] with questions or concerns regarding your invoice.   Our billing staff will not be able to assist you with questions regarding bills from these companies.  You will be contacted with the lab results as soon as they are available. The fastest way to get your results is to activate your My Chart account. Instructions are located on the last page of this paperwork. If you have not heard from [place] regarding the results in 2 weeks, please contact this office.

## 2020-03-15 ENCOUNTER — Telehealth: Payer: Self-pay | Admitting: Hematology

## 2020-03-15 ENCOUNTER — Encounter: Payer: Self-pay | Admitting: Hematology

## 2020-03-15 NOTE — Telephone Encounter (Signed)
A new hem appt has been scheduled for Jared Galvan to see Dr. Irene Limbo on 7/12 at 1pm. Letter mailed to the pt and referring office notified.

## 2020-04-05 ENCOUNTER — Other Ambulatory Visit: Payer: Self-pay

## 2020-04-05 ENCOUNTER — Inpatient Hospital Stay: Payer: BC Managed Care – PPO

## 2020-04-05 ENCOUNTER — Inpatient Hospital Stay: Payer: BC Managed Care – PPO | Attending: Hematology | Admitting: Hematology

## 2020-04-05 VITALS — BP 120/71 | HR 84 | Temp 97.9°F | Resp 18 | Ht 62.25 in | Wt 180.1 lb

## 2020-04-05 DIAGNOSIS — E785 Hyperlipidemia, unspecified: Secondary | ICD-10-CM

## 2020-04-05 DIAGNOSIS — R718 Other abnormality of red blood cells: Secondary | ICD-10-CM

## 2020-04-05 DIAGNOSIS — Z8049 Family history of malignant neoplasm of other genital organs: Secondary | ICD-10-CM | POA: Insufficient documentation

## 2020-04-05 DIAGNOSIS — R7989 Other specified abnormal findings of blood chemistry: Secondary | ICD-10-CM | POA: Diagnosis not present

## 2020-04-05 DIAGNOSIS — J302 Other seasonal allergic rhinitis: Secondary | ICD-10-CM | POA: Insufficient documentation

## 2020-04-05 DIAGNOSIS — R05 Cough: Secondary | ICD-10-CM | POA: Diagnosis not present

## 2020-04-05 LAB — CBC WITH DIFFERENTIAL/PLATELET
Abs Immature Granulocytes: 0.02 10*3/uL (ref 0.00–0.07)
Basophils Absolute: 0 10*3/uL (ref 0.0–0.1)
Basophils Relative: 0 %
Eosinophils Absolute: 0.2 10*3/uL (ref 0.0–0.5)
Eosinophils Relative: 2 %
HCT: 40.9 % (ref 39.0–52.0)
Hemoglobin: 13.4 g/dL (ref 13.0–17.0)
Immature Granulocytes: 0 %
Lymphocytes Relative: 46 %
Lymphs Abs: 4.8 10*3/uL — ABNORMAL HIGH (ref 0.7–4.0)
MCH: 25.9 pg — ABNORMAL LOW (ref 26.0–34.0)
MCHC: 32.8 g/dL (ref 30.0–36.0)
MCV: 79 fL — ABNORMAL LOW (ref 80.0–100.0)
Monocytes Absolute: 0.7 10*3/uL (ref 0.1–1.0)
Monocytes Relative: 7 %
Neutro Abs: 4.7 10*3/uL (ref 1.7–7.7)
Neutrophils Relative %: 45 %
Platelets: 216 10*3/uL (ref 150–400)
RBC: 5.18 MIL/uL (ref 4.22–5.81)
RDW: 13.5 % (ref 11.5–15.5)
WBC: 10.4 10*3/uL (ref 4.0–10.5)
nRBC: 0 % (ref 0.0–0.2)

## 2020-04-05 LAB — CMP (CANCER CENTER ONLY)
ALT: 113 U/L — ABNORMAL HIGH (ref 0–44)
AST: 62 U/L — ABNORMAL HIGH (ref 15–41)
Albumin: 4.1 g/dL (ref 3.5–5.0)
Alkaline Phosphatase: 65 U/L (ref 38–126)
Anion gap: 9 (ref 5–15)
BUN: 25 mg/dL — ABNORMAL HIGH (ref 6–20)
CO2: 25 mmol/L (ref 22–32)
Calcium: 9.4 mg/dL (ref 8.9–10.3)
Chloride: 106 mmol/L (ref 98–111)
Creatinine: 1.02 mg/dL (ref 0.61–1.24)
GFR, Est AFR Am: >60 mL/min
GFR, Estimated: >60 mL/min
Glucose, Bld: 109 mg/dL — ABNORMAL HIGH (ref 70–99)
Potassium: 3.8 mmol/L (ref 3.5–5.1)
Sodium: 140 mmol/L (ref 135–145)
Total Bilirubin: 1.1 mg/dL (ref 0.3–1.2)
Total Protein: 7.1 g/dL (ref 6.5–8.1)

## 2020-04-05 LAB — IRON AND TIBC
Iron: 117 ug/dL (ref 42–163)
Saturation Ratios: 33 % (ref 20–55)
TIBC: 350 ug/dL (ref 202–409)
UIBC: 234 ug/dL (ref 117–376)

## 2020-04-05 LAB — LACTATE DEHYDROGENASE: LDH: 174 U/L (ref 98–192)

## 2020-04-05 LAB — FERRITIN: Ferritin: 793 ng/mL — ABNORMAL HIGH (ref 24–336)

## 2020-04-05 NOTE — Progress Notes (Signed)
HEMATOLOGY/ONCOLOGY CONSULTATION NOTE  Date of Service: 04/05/2020  No care team member to display  CHIEF COMPLAINTS/PURPOSE OF CONSULTATION:  Microcytosis and elevated liver enzymes  HISTORY OF PRESENTING ILLNESS:   Jared Galvan is a wonderful 49 y.o. male who has been referred to [place] by Jenetta Downer, FNP-C for evaluation and management of elevated liver enzymes and microcytosis. The pt reports that he is doing well overall.   The pt reports that he does not have many chronic medical issues. Pt has HLD and was given Crestor last year, but has since discontinued the medication. He does have seasonal allergies that create a cough. Pt has significant exposure to vaporized chemicals at work and does not use a respirator mask. Pt has no known medication allergies, has never received a blood transfusion and has not donated any blood. He has no family history of blood disorders, but remembers that his mother passed from a "woman's cancer". Pt was eating red meat daily prior to his last lab, but does not cook in a cast iron skillet. Pt is an immigrant from [place] and previously lived in [place]. He as felt the same over the last 6-12 months, is eating well, and denies any new symptoms.   Most recent lab results (03/01/2020) of CBC w/diff is as follows: all values are WNL except for RBC at 5.98, MCH at 25.6, Lymphs Abs at 3.7K.  03/01/2020 Ferritin at 1036 03/01/2020 Iron Panel is as follows: TIBC at 366, UIBC at 260, Iron at 106, Iron Sat at 29. 02/26/2020 CMP is as follows: all values are WNL except for AST at 97, ALT at 209  On review of systems, pt denies low appetite, unexpected weight loss, abdominal pain, joint pain, vision changes, constipation, dysuria and any other symptoms.   On PMHx the pt reports HLD, Allergies. On Social Hx the pt reports he is a non-smoker and does not drink any alcohol. Pt was an alcohol user in his youth, but quit 20+ years ago.   MEDICAL HISTORY:    No past medical history on file.  Hyperlipidemia  Overweight   SURGICAL HISTORY: No past surgical history on file.  SOCIAL HISTORY: Social History   Socioeconomic History  . Marital status: Married    Spouse name: Not on file  . Number of children: 1  . Years of education: 24  . Highest education level: Not on file  Occupational History  . Occupation: chemical  Tobacco Use  . Smoking status: Never Smoker  . Smokeless tobacco: Never Used  Substance and Sexual Activity  . Alcohol use: No  . Drug use: No  . Sexual activity: Yes    Partners: Female  Other Topics Concern  . Not on file  Social History Narrative   From [place].  Came to the [place] in 1992. Lives with his wife and their daughter.  His brother also lives with them.   Social Determinants of Health   Financial Resource Strain:   . Difficulty of Paying Living Expenses:   Food Insecurity:   . Worried About Charity fundraiser in the Last Year:   . Arboriculturist in the Last Year:   Transportation Needs:   . Film/video editor (Medical):   Marland Kitchen Lack of Transportation (Non-Medical):   Physical Activity:   . Days of Exercise per Week:   . Minutes of Exercise per Session:   Stress:   . Feeling of Stress :   Social Connections:   .  Frequency of Communication with Friends and Family:   . Frequency of Social Gatherings with Friends and Family:   . Attends Religious Services:   . Active Member of Clubs or Organizations:   . Attends Archivist Meetings:   Marland Kitchen Marital Status:   Intimate Partner Violence:   . Fear of Current or Ex-Partner:   . Emotionally Abused:   Marland Kitchen Physically Abused:   . Sexually Abused:     FAMILY HISTORY: Family History  Problem Relation Age of Onset  . Cancer Mother 48       uterine    ALLERGIES:  has No Known Allergies.  MEDICATIONS:  Current Outpatient Medications  Medication Sig Dispense Refill  . amoxicillin (AMOXIL) 875 MG tablet Take 1 tablet (875 mg total) by mouth  2 (two) times daily. 20 tablet 0  . benzonatate (TESSALON) 100 MG capsule Take 1-2 capsules (100-200 mg total) by mouth 3 (three) times daily as needed. 30 capsule 0  . cetirizine (ZYRTEC) 10 MG tablet Take 1 tablet (10 mg total) by mouth daily. 30 tablet 11  . chlorpheniramine-HYDROcodone (TUSSIONEX PENNKINETIC ER) 10-8 MG/5ML SUER Take 5 mLs by mouth at bedtime as needed for cough. 100 mL 0  . pseudoephedrine (SUDAFED 12 HOUR) 120 MG 12 hr tablet Take 1 tablet (120 mg total) by mouth 2 (two) times daily. 30 tablet 3   No current facility-administered medications for this visit.    REVIEW OF SYSTEMS:    10 Point review of Systems was done is negative except as noted above.  PHYSICAL EXAMINATION: ECOG PERFORMANCE STATUS: 0 - Asymptomatic  .There were no vitals filed for this visit. There were no vitals filed for this visit. .There is no height or weight on file to calculate BMI.  GENERAL:alert, in no acute distress and comfortable SKIN: no acute rashes, no significant lesions EYES: conjunctiva are pink and non-injected, sclera anicteric OROPHARYNX: MMM, no exudates, no oropharyngeal erythema or ulceration NECK: supple, no JVD LYMPH:  no palpable lymphadenopathy in the cervical, axillary or inguinal regions LUNGS: clear to auscultation b/l with normal respiratory effort HEART: regular rate & rhythm ABDOMEN:  normoactive bowel sounds , non tender, not distended. Extremity: no pedal edema PSYCH: alert & oriented x 3 with fluent speech NEURO: no focal motor/sensory deficits  LABORATORY DATA:  I have reviewed the data as listed  . CBC Latest Ref Rng & Units 10/07/2015 09/13/2015 05/13/2012  WBC 4.0 - 10.5 K/uL 7.4 8.5 7.3  Hemoglobin 13.0 - 17.0 g/dL 14.6 14.4 15.0  Hematocrit 39 - 52 % 43.5 42.3(A) 42.4  Platelets 150 - 400 K/uL 252 - 259    . CMP Latest Ref Rng & Units 10/07/2015 09/13/2015 05/13/2012  Glucose 65 - 99 mg/dL - 88 85  BUN 7 - 25 mg/dL - 20 17  Creatinine 0.60 -  1.35 mg/dL - 0.80 0.98  Sodium 135 - 146 mmol/L - 139 139  Potassium 3.5 - 5.3 mmol/L - 4.3 4.0  Chloride 98 - 110 mmol/L - 104 103  CO2 20 - 31 mmol/L - 26 28  Calcium 8.6 - 10.3 mg/dL - 9.6 10.1  Total Protein 6.1 - 8.1 g/dL 7.2 7.1 7.8  Total Bilirubin 0.2 - 1.2 mg/dL 0.6 0.4 0.5  Alkaline Phos 40 - 115 U/L 56 55 53  AST 10 - 40 U/L 28 30 23   ALT 9 - 46 U/L 50(H) 53(H) 32     RADIOGRAPHIC STUDIES: I have personally reviewed the radiological images as listed  and agreed with the findings in the report. No results found.  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  -Discussed 03/01/2020 Ferritin at 1036 -Discussed 03/01/2020 Iron Panel is as follows: TIBC at 366, UIBC at 260, Iron at 106, Iron Sat at 29. -Discussed 02/26/2020 CMP is as follows: all values are WNL except for AST at 97, ALT at 209 -Advised pt that his microcytosis could be caused by genetic changes, as pt appears to lack any nutritional deficiencies.  -Advised pt that iron-rich diet can contribute to elevated Ferritin, but that does not completely explain his Ferritin being >1000.  -Advised pt that he could have hemochromatosis, which would be managed via diet and potentially therapeutic phlebotomies.  -Will get labs today  -Will get [place] Abd in 1 week  -Will see back in 2 weeks    FOLLOW UP: Labs today [place] abd in 1 week RTC with Dr Irene Limbo in 2 weeks  . Orders Placed This Encounter  Procedures  . US Abdomen Complete    Standing Status:   Future    Standing Expiration Date:   04/05/2021    Order Specific Question:   Reason for Exam (SYMPTOM  OR DIAGNOSIS REQUIRED)    Answer:   Abnormal liver function tests for further evaluation of hepatobiliary disease    Order  Specific Question:   Preferred imaging location?    Answer:   [facility]  . CBC with Differential/Platelet    Standing Status:   Future    Number of Occurrences:   1    Standing Expiration Date:   04/05/2021  . CMP ([facility] only)    Standing Status:   Future    Number of Occurrences:   1    Standing Expiration Date:   04/05/2021  . Lactate dehydrogenase    Standing Status:   Future    Number of Occurrences:   1    Standing Expiration Date:   04/05/2021  . Haptoglobin    Standing Status:   Future    Number of Occurrences:   1    Standing Expiration Date:   04/05/2021  . Ferritin    Standing Status:   Future    Number of Occurrences:   1    Standing Expiration Date:   04/05/2021  . Iron and TIBC    Standing Status:   Future    Number of Occurrences:   1    Standing Expiration Date:   04/05/2021  . HFE-Associated Hereditary Hemochromatosis (COHESION)    Standing Status:   Future    Number of Occurrences:   1    Standing Expiration Date:   04/05/2021  . Hgb Fractionation Cascade    Standing Status:   Future    Number of Occurrences:   1    Standing Expiration Date:   04/05/2021  . Hemochromatosis DNA, PCR    Standing Status:   Future    Standing Expiration Date:   04/05/2021     All of the patients questions were answered with apparent satisfaction. The patient knows to call the clinic with any problems, questions or concerns.  I spent 51mins counseling the patient face to face. The total time spent in the appointment was 45 minutes and more  than 50% was on counseling and direct patient cares.    Sullivan Lone MD [place] AAHIVMS [facility] Hematology/Oncology Physician [facility]  (Office):       [phone] (Work cell):  [phone] (Fax):           [phone]  04/05/2020 7:55 AM  I, Yevette Edwards, am acting as a scribe for Dr. Sullivan Lone.   .I have reviewed the above documentation for accuracy and completeness, and I agree with the  above. Brunetta Genera MD

## 2020-04-06 LAB — HAPTOGLOBIN: Haptoglobin: 141 mg/dL (ref 23–355)

## 2020-04-07 LAB — HEMOCHROMATOSIS DNA-PCR(C282Y,H63D)

## 2020-04-08 LAB — HGB FRACTIONATION BY HPLC
Hgb A: 65 % — ABNORMAL LOW (ref 96.4–98.8)
Hgb C: 0 %
Hgb E: 29.8 % — ABNORMAL HIGH
Hgb F: 1.8 % (ref 0.0–2.0)
Hgb S: 0 %
Hgb Variant: 0 %

## 2020-04-08 LAB — HGB FRACTIONATION CASCADE: Hgb A2: 3.4 % — ABNORMAL HIGH (ref 1.8–3.2)

## 2020-04-19 ENCOUNTER — Ambulatory Visit (HOSPITAL_COMMUNITY)
Admission: RE | Admit: 2020-04-19 | Discharge: 2020-04-19 | Disposition: A | Discharge status: Home / Self Care | Payer: BC Managed Care – PPO | Source: Ambulatory Visit | Attending: Hematology | Admitting: Hematology

## 2020-04-19 ENCOUNTER — Other Ambulatory Visit: Payer: Self-pay

## 2020-04-19 DIAGNOSIS — R718 Other abnormality of red blood cells: Secondary | ICD-10-CM | POA: Insufficient documentation

## 2020-04-19 DIAGNOSIS — K760 Fatty (change of) liver, not elsewhere classified: Secondary | ICD-10-CM | POA: Diagnosis not present

## 2020-04-19 DIAGNOSIS — R7989 Other specified abnormal findings of blood chemistry: Secondary | ICD-10-CM | POA: Insufficient documentation

## 2020-04-19 DIAGNOSIS — K824 Cholesterolosis of gallbladder: Secondary | ICD-10-CM | POA: Diagnosis not present

## 2020-04-19 DIAGNOSIS — K7689 Other specified diseases of liver: Secondary | ICD-10-CM | POA: Diagnosis not present

## 2020-04-19 NOTE — Progress Notes (Signed)
HEMATOLOGY/ONCOLOGY CONSULTATION NOTE  Date of Service: 04/20/2020  Patient Care Team: Patient, No Pcp Per as PCP - General (General Practice)  CHIEF COMPLAINTS/PURPOSE OF CONSULTATION:  Microcytosis and Elevated liver enzymes Elevated ferritin levels  HISTORY OF PRESENTING ILLNESS:   Jared Galvan is a wonderful 49 y.o. male who has been referred to [place] by Jenetta Downer, FNP-C for evaluation and management of elevated liver enzymes and microcytosis. The pt reports that he is doing well overall.   The pt reports that he does not have many chronic medical issues. Pt has HLD and was given Crestor last year, but has since discontinued the medication. He does have seasonal allergies that create a cough. Pt has significant exposure to vaporized chemicals at work and does not use a respirator mask. Pt has no known medication allergies, has never received a blood transfusion and has not donated any blood. He has no family history of blood disorders, but remembers that his mother passed from a "woman's cancer". Pt was eating red meat daily prior to his last lab, but does not cook in a cast iron skillet. Pt is an immigrant from [place] and previously lived in [place]. He as felt the same over the last 6-12 months, is eating well, and denies any new symptoms.   Most recent lab results (03/01/2020) of CBC w/diff is as follows: all values are WNL except for RBC at 5.98, MCH at 25.6, Lymphs Abs at 3.7K.  03/01/2020 Ferritin at 1036 03/01/2020 Iron Panel is as follows: TIBC at 366, UIBC at 260, Iron at 106, Iron Sat at 29. 02/26/2020 CMP is as follows: all values are WNL except for AST at 97, ALT at 209  On review of systems, pt denies low appetite, unexpected weight loss, abdominal pain, joint pain, vision changes, constipation, dysuria and any other symptoms.   On PMHx the pt reports HLD, Allergies. On Social Hx the pt reports he is a non-Galvan and does not drink any alcohol. Pt was an  alcohol user in his youth, but quit 20+ years ago.    INTERVAL HISTORY:  Jared Galvan is a wonderful 49 y.o. male who is here for evaluation and management of elevated liver enzymes and microcytosis. The patient's last visit with [place] was on 04/05/2020. The pt reports that he is doing well overall.  The pt reports that he has no history of taking PO Iron and does not regularly cook in cast iron skillets. He denies any new symptoms.  Of note since the patient's last visit, pt has had [place] Abd ([id number]) completed on 04/19/2020 with results revealing "Hepatic steatosis. Nonspecific 1.6 cm right hepatic hypoechoic focus. Differential includes focal fatty sparing however alternative pathologies cannot be excluded. Consider MRI abdomen with and without contrast for further evaluation. Gallbladder polyps."  Lab results (04/05/20) of CBC w/diff and CMP is as follows: all values are WNL except for MCV at 79.0, MCH at 25.9, Lymphs Abs at 4.8K, Glucose at 109, BUN at 25, AST at 62, ALT at 113. 04/05/2020 LDH at 174 04/05/2020 Ferritin at 793 04/05/2020 Haptoglobin at 141 04/05/2020 Hemochromatosis DNA revealed "No Mutation Identified" 04/05/2020 Hgb Fractionation Cascade shows all values are WNL except for Hgb A at 65.0, Hgb A2 at 3.4 04/05/2020 Hgb Fractionation shows all values are WNL except for Hgb A at 65.0, Hgb E at 29.8 04/05/2020 Iron Panel is as follows: Iron at 117, TIBC at 350, Sat Ratios at 33, UIBC at 234  On  review of systems, pt denies any other symptoms.   MEDICAL HISTORY:  Hyperlipidemia  Overweight   SURGICAL HISTORY: No past surgical history on file.  SOCIAL HISTORY: Social History   Socioeconomic History  . Marital status: Married    Spouse name: Not on file  . Number of children: 1  . Years of education: 36  . Highest education level: Not on file  Occupational History  . Occupation: chemical  Tobacco Use  . Smoking status: Never Galvan  . Smokeless tobacco: Never Used   Substance and Sexual Activity  . Alcohol use: No  . Drug use: No  . Sexual activity: Yes    Partners: Female  Other Topics Concern  . Not on file  Social History Narrative   From [place].  Came to the [place] in 1992. Lives with his wife and their daughter.  His brother also lives with them.   Social Determinants of Health   Financial Resource Strain:   . Difficulty of Paying Living Expenses:   Food Insecurity:   . Worried About Charity fundraiser in the Last Year:   . Arboriculturist in the Last Year:   Transportation Needs:   . Film/video editor (Medical):   Marland Kitchen Lack of Transportation (Non-Medical):   Physical Activity:   . Days of Exercise per Week:   . Minutes of Exercise per Session:   Stress:   . Feeling of Stress :   Social Connections:   . Frequency of Communication with Friends and Family:   . Frequency of Social Gatherings with Friends and Family:   . Attends Religious Services:   . Active Member of Clubs or Organizations:   . Attends Archivist Meetings:   Marland Kitchen Marital Status:   Intimate Partner Violence:   . Fear of Current or Ex-Partner:   . Emotionally Abused:   Marland Kitchen Physically Abused:   . Sexually Abused:     FAMILY HISTORY: Family History  Problem Relation Age of Onset  . Cancer Mother 29       uterine    ALLERGIES:  has No Known Allergies.  MEDICATIONS:  Current Outpatient Medications  Medication Sig Dispense Refill  . amoxicillin (AMOXIL) 875 MG tablet Take 1 tablet (875 mg total) by mouth 2 (two) times daily. 20 tablet 0  . benzonatate (TESSALON) 100 MG capsule Take 1-2 capsules (100-200 mg total) by mouth 3 (three) times daily as needed. 30 capsule 0  . cetirizine (ZYRTEC) 10 MG tablet Take 1 tablet (10 mg total) by mouth daily. 30 tablet 11  . chlorpheniramine-HYDROcodone (TUSSIONEX PENNKINETIC ER) 10-8 MG/5ML SUER Take 5 mLs by mouth at bedtime as needed for cough. 100 mL 0  . pseudoephedrine (SUDAFED 12 HOUR) 120 MG 12 hr tablet Take  1 tablet (120 mg total) by mouth 2 (two) times daily. 30 tablet 3   No current facility-administered medications for this visit.    REVIEW OF SYSTEMS:   A 10+ POINT REVIEW OF SYSTEMS WAS OBTAINED including neurology, dermatology, psychiatry, cardiac, respiratory, lymph, extremities, GI, GU, Musculoskeletal, constitutional, breasts, reproductive, HEENT.  All pertinent positives are noted in the HPI.  All others are negative.   PHYSICAL EXAMINATION: ECOG PERFORMANCE STATUS: 0 - Asymptomatic  . Vitals:   04/20/20 1038  BP: 128/72  Pulse: 82  Resp: 18  Temp: 97.9 F (36.6 C)  SpO2: 98%   Filed Weights   04/20/20 1038  Weight: 184 lb 1.6 oz (83.5 kg)   .Body mass  index is 33.4 kg/m.   GENERAL:alert, in no acute distress and comfortable SKIN: no acute rashes, no significant lesions EYES: conjunctiva are pink and non-injected, sclera anicteric OROPHARYNX: MMM, no exudates, no oropharyngeal erythema or ulceration NECK: supple, no JVD LYMPH:  no palpable lymphadenopathy in the cervical, axillary or inguinal regions LUNGS: clear to auscultation b/l with normal respiratory effort HEART: regular rate & rhythm ABDOMEN:  normoactive bowel sounds , non tender, not distended. No palpable hepatosplenomegaly.  Extremity: no pedal edema PSYCH: alert & oriented x 3 with fluent speech NEURO: no focal motor/sensory deficits  LABORATORY DATA:  I have reviewed the data as listed  . CBC Latest Ref Rng & Units 04/23/2020 04/05/2020 10/07/2015  WBC 4.0 - 10.5 K/uL 8.6 10.4 7.4  Hemoglobin 13.0 - 17.0 g/dL 14.0 13.4 14.6  Hematocrit 39 - 52 % 42.4 40.9 43.5  Platelets 150 - 400 K/uL 237 216 252   . CBC    Component Value Date/Time   WBC 8.6 04/23/2020 0923   WBC 10.4 04/05/2020 1419   RBC 5.35 04/23/2020 0923   HGB 14.0 04/23/2020 0923   HCT 42.4 04/23/2020 0923   PLT 237 04/23/2020 0923   MCV 79.3 (L) 04/23/2020 0923   MCV 76.0 (A) 09/13/2015 1455   MCH 26.2 04/23/2020 0923   MCHC  33.0 04/23/2020 0923   RDW 13.8 04/23/2020 0923   LYMPHSABS 3.8 04/23/2020 0923   MONOABS 0.4 04/23/2020 0923   EOSABS 0.2 04/23/2020 0923   BASOSABS 0.0 04/23/2020 0923    . CMP Latest Ref Rng & Units 04/23/2020 04/05/2020 10/07/2015  Glucose 70 - 99 mg/dL 145(H) 109(H) -  BUN 6 - 20 mg/dL 24(H) 25(H) -  Creatinine 0.61 - 1.24 mg/dL 1.02 1.02 -  Sodium 135 - 145 mmol/L 138 140 -  Potassium 3.5 - 5.1 mmol/L 4.0 3.8 -  Chloride 98 - 111 mmol/L 105 106 -  CO2 22 - 32 mmol/L 23 25 -  Calcium 8.9 - 10.3 mg/dL 10.6(H) 9.4 -  Total Protein 6.5 - 8.1 g/dL 7.6 7.1 7.2  Total Bilirubin 0.3 - 1.2 mg/dL 0.7 1.1 0.6  Alkaline Phos 38 - 126 U/L 68 65 56  AST 15 - 41 U/L 97(H) 62(H) 28  ALT 0 - 44 U/L 181(H) 113(H) 50(H)    Hemochromatosis DNA-PCR(c282y,h63d) Order: [id number] Status:  Final result Visible to patient:  Yes (not seen) Next appt:  05/21/2020 at 08:45 AM in Oncology [facility] Lab 1)  0 Result Notes Component 3 wk ago  DNA Mutation Analysis Comment   Comment: (NOTE)  NO MUTATION IDENTIFIED        Component     Latest Ref Rng & Units 04/05/2020 04/23/2020  HGB F     0.0 - 2.0 % 1.8   Hgb A     96.4 - 98.8 % 65.0 (L)   HGB A2     1.8 - 3.2 % Comment   HGB S     0.0 % 0.0   HGB C     0.0 % 0.0   Hgb E     0.0 % 29.8 (H)   HGB VARIANT     0.0 % 0.0   Final Interpretation:      Comment   Iron     42 - 163 ug/dL  78  TIBC     202 - 409 ug/dL  379  Saturation Ratios     20 - 55 %  21  UIBC  117 - 376 ug/dL  300  LDH     98 - 192 U/L 174   Ferritin     24 - 336 ng/mL  1,558 (H)   Hgb Fractionation by HPLC      No reference range information available      Resulting Lab: [facility]      Comments:           (NOTE)           Hemoglobin pattern and concentrations are consistent with            Hgb E trait           (heterozygous). RADIOGRAPHIC STUDIES: I have personally reviewed the radiological images as listed and agreed with the  findings in the report. US Abdomen Complete  Result Date: 04/19/2020 CLINICAL DATA:  Abnormal liver function test. EXAM: ABDOMEN ULTRASOUND COMPLETE COMPARISON:  09/13/2015 abdominal radiograph. FINDINGS: Gallbladder: No gallstones or wall thickening visualized. Gallbladder polyps measuring up to 5.4 mm. No sonographic Murphy sign noted by sonographer. Common bile duct: Diameter: 5.3 mm, within normal limits. Liver: 1.4 x 1.1 x 1.6 cm hypoechoic lesion. Increased parenchymal echogenicity. Portal vein is patent on color Doppler imaging with normal direction of blood flow towards the liver. IVC: No abnormality visualized. Pancreas: Visualized portion unremarkable. Spleen: Size and appearance within normal limits. Right Kidney: Length: 11.8 cm. Echogenicity within normal limits. No mass or hydronephrosis visualized. Left Kidney: Length: 12.0 cm. Echogenicity within normal limits. No mass or hydronephrosis visualized. Abdominal aorta: No aneurysm visualized. Other findings: None. IMPRESSION: Hepatic steatosis. Nonspecific 1.6 cm right hepatic hypoechoic focus. Differential includes focal fatty sparing however alternative pathologies cannot be excluded. Consider MRI abdomen with and without contrast for further evaluation. Gallbladder polyps. Electronically Signed   By: Primitivo Gauze M.D.   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 3) Abnormal Liver function tests [place] Abd - fatty liver with rt hepatic lesion 1.6 cms PLAN: -Discussed pt labwork, 04/05/20; WBC, PLT, & Hgb are nml; ALT & AST are elevated, LDH & Haptoglobin  is WNL, Ferritin is very high -Discussed 04/05/2020 Hemochromatosis DNA which revealed "No Mutation Identified" -Discussed 04/05/2020 Hgb Fractionation/Cascade shows Hgb A at 65.0, Hgb E at 29.8, Hgb A2 at 3.4 - discussed new diagnosis of Hgb E trait -Discussed 04/05/2020 Iron Panel is as follows: Iron at 117, TIBC at 350, Sat Ratios at 33, UIBC at 234 -Discussed 04/19/2020 [place] Abd ([id number]) which revealed "Hepatic steatosis. Nonspecific 1.6 cm right hepatic hypoechoic focus. Differential includes focal fatty sparing however alternative pathologies cannot be excluded. Gallbladder polyps." -Advised pt that Hgb E could be causing his bowels to absorb more iron, which would fit with the trend of iron levels increasing slowly. (ineffective erythropoeisis) -Advised pt that Chronic liver disease could have caused increased iron absorption (secondary hemosiderosis). Recommend pt f/u with Gastroenterology for liver evaluation. -ordered hepatitis labs -Advised pt that when Ferritin >800 it can increase organ injury, especially in the liver & heart. Goal Ferritin <200.  -Advised pt that therapeutic phlebotomies or blood donation would be the only way to remove Ferritin.  -Recommend pt continue to minimize alcohol  intake, continue to avoid any iron supplementation, and minimize red meat consuption.   -Advised pt that there is a chance that he has passed his Hgb E trait to his children.  -Recommend pt establish care at a [facility].  -Will give therapeutic phlebotomy q4weeks x3 -Will get MRI Abd in 1 week to evaluate liver and liver lesion. -Will refer pt to [facility] in 1-2 weeks -Will see back in 3 months with labs   FOLLOW UP: MRI Abd in 1 week PLz schedule Therapeutic phlebotomy every 4 weeks x 3 with labs starting ASAP  GI referral for abnormal Liver function tests in 1-2 weeks RTC with Dr Irene Limbo in 3 months with labs   The total time spent in the appt was 30 minutes and more than  50% was on counseling and direct patient cares.  All of the patient's questions were answered with apparent satisfaction. The patient knows to call the clinic with any problems, questions or concerns.    Sullivan Lone MD [place] AAHIVMS [facility] Hematology/Oncology Physician [facility]  (Office):       [phone] (Work cell):  [phone] (Fax):           [phone]  04/20/2020 11:27 AM  I, Yevette Edwards, am acting as a scribe for Dr. Sullivan Lone.   .I have reviewed the above documentation for accuracy and completeness, and I agree with the above. Brunetta Genera MD    CC Jenetta Downer, FNP-C

## 2020-04-20 ENCOUNTER — Inpatient Hospital Stay (HOSPITAL_BASED_OUTPATIENT_CLINIC_OR_DEPARTMENT_OTHER): Payer: BC Managed Care – PPO | Admitting: Hematology

## 2020-04-20 ENCOUNTER — Other Ambulatory Visit: Payer: Self-pay

## 2020-04-20 VITALS — BP 128/72 | HR 82 | Temp 97.9°F | Resp 18 | Ht 62.25 in | Wt 184.1 lb

## 2020-04-20 DIAGNOSIS — R7989 Other specified abnormal findings of blood chemistry: Secondary | ICD-10-CM | POA: Diagnosis not present

## 2020-04-20 DIAGNOSIS — J302 Other seasonal allergic rhinitis: Secondary | ICD-10-CM | POA: Diagnosis not present

## 2020-04-20 DIAGNOSIS — Z8049 Family history of malignant neoplasm of other genital organs: Secondary | ICD-10-CM | POA: Diagnosis not present

## 2020-04-20 DIAGNOSIS — K769 Liver disease, unspecified: Secondary | ICD-10-CM | POA: Diagnosis not present

## 2020-04-20 DIAGNOSIS — E785 Hyperlipidemia, unspecified: Secondary | ICD-10-CM | POA: Diagnosis not present

## 2020-04-20 DIAGNOSIS — R718 Other abnormality of red blood cells: Secondary | ICD-10-CM | POA: Diagnosis not present

## 2020-04-20 DIAGNOSIS — R05 Cough: Secondary | ICD-10-CM | POA: Diagnosis not present

## 2020-04-20 DIAGNOSIS — R945 Abnormal results of liver function studies: Secondary | ICD-10-CM

## 2020-04-21 ENCOUNTER — Telehealth: Payer: Self-pay | Admitting: Hematology

## 2020-04-21 NOTE — Telephone Encounter (Signed)
Called patient regarding scheduling, left a voicemail.

## 2020-04-22 ENCOUNTER — Other Ambulatory Visit: Payer: Self-pay | Admitting: *Deleted

## 2020-04-22 ENCOUNTER — Telehealth: Payer: Self-pay | Admitting: Hematology

## 2020-04-22 DIAGNOSIS — R7989 Other specified abnormal findings of blood chemistry: Secondary | ICD-10-CM

## 2020-04-22 NOTE — Telephone Encounter (Signed)
Scheduled per los, patient has been called and notified. 

## 2020-04-23 ENCOUNTER — Inpatient Hospital Stay: Payer: BC Managed Care – PPO

## 2020-04-23 ENCOUNTER — Other Ambulatory Visit: Payer: Self-pay

## 2020-04-23 DIAGNOSIS — R7989 Other specified abnormal findings of blood chemistry: Secondary | ICD-10-CM | POA: Diagnosis not present

## 2020-04-23 DIAGNOSIS — R05 Cough: Secondary | ICD-10-CM | POA: Diagnosis not present

## 2020-04-23 DIAGNOSIS — Z8049 Family history of malignant neoplasm of other genital organs: Secondary | ICD-10-CM | POA: Diagnosis not present

## 2020-04-23 DIAGNOSIS — J302 Other seasonal allergic rhinitis: Secondary | ICD-10-CM | POA: Diagnosis not present

## 2020-04-23 DIAGNOSIS — R718 Other abnormality of red blood cells: Secondary | ICD-10-CM | POA: Diagnosis not present

## 2020-04-23 DIAGNOSIS — E785 Hyperlipidemia, unspecified: Secondary | ICD-10-CM | POA: Diagnosis not present

## 2020-04-23 LAB — CBC WITH DIFFERENTIAL (CANCER CENTER ONLY)
Abs Immature Granulocytes: 0.01 10*3/uL (ref 0.00–0.07)
Basophils Absolute: 0 10*3/uL (ref 0.0–0.1)
Basophils Relative: 0 %
Eosinophils Absolute: 0.2 10*3/uL (ref 0.0–0.5)
Eosinophils Relative: 2 %
HCT: 42.4 % (ref 39.0–52.0)
Hemoglobin: 14 g/dL (ref 13.0–17.0)
Immature Granulocytes: 0 %
Lymphocytes Relative: 45 %
Lymphs Abs: 3.8 10*3/uL (ref 0.7–4.0)
MCH: 26.2 pg (ref 26.0–34.0)
MCHC: 33 g/dL (ref 30.0–36.0)
MCV: 79.3 fL — ABNORMAL LOW (ref 80.0–100.0)
Monocytes Absolute: 0.4 10*3/uL (ref 0.1–1.0)
Monocytes Relative: 5 %
Neutro Abs: 4.1 10*3/uL (ref 1.7–7.7)
Neutrophils Relative %: 48 %
Platelet Count: 237 10*3/uL (ref 150–400)
RBC: 5.35 MIL/uL (ref 4.22–5.81)
RDW: 13.8 % (ref 11.5–15.5)
WBC Count: 8.6 10*3/uL (ref 4.0–10.5)
nRBC: 0 % (ref 0.0–0.2)

## 2020-04-23 LAB — CMP (CANCER CENTER ONLY)
ALT: 181 U/L — ABNORMAL HIGH (ref 0–44)
AST: 97 U/L — ABNORMAL HIGH (ref 15–41)
Albumin: 4.3 g/dL (ref 3.5–5.0)
Alkaline Phosphatase: 68 U/L (ref 38–126)
Anion gap: 10 (ref 5–15)
BUN: 24 mg/dL — ABNORMAL HIGH (ref 6–20)
CO2: 23 mmol/L (ref 22–32)
Calcium: 10.6 mg/dL — ABNORMAL HIGH (ref 8.9–10.3)
Chloride: 105 mmol/L (ref 98–111)
Creatinine: 1.02 mg/dL (ref 0.61–1.24)
GFR, Est AFR Am: >60 mL/min (ref >60)
GFR, Estimated: >60 mL/min (ref >60)
Glucose, Bld: 145 mg/dL — ABNORMAL HIGH (ref 70–99)
Potassium: 4 mmol/L (ref 3.5–5.1)
Sodium: 138 mmol/L (ref 135–145)
Total Bilirubin: 0.7 mg/dL (ref 0.3–1.2)
Total Protein: 7.6 g/dL (ref 6.5–8.1)

## 2020-04-23 LAB — IRON AND TIBC
Iron: 78 ug/dL (ref 42–163)
Saturation Ratios: 21 % (ref 20–55)
TIBC: 379 ug/dL (ref 202–409)
UIBC: 300 ug/dL (ref 117–376)

## 2020-04-23 LAB — FERRITIN: Ferritin: 1558 ng/mL — ABNORMAL HIGH (ref 24–336)

## 2020-04-23 NOTE — Progress Notes (Signed)
OK for Phlebotomy today per Dr. Lorenso Courier. HGB is 14.0 today and last Ferritin on 04/05/20 was 793.

## 2020-04-23 NOTE — Patient Instructions (Signed)

## 2020-04-23 NOTE — Progress Notes (Signed)
Jared Galvan presents today for phlebotomy per MD orders. Phlebotomy procedure started at 1012 and ended at . 16 g R antecubital, phlebotomy set used. 524 grams removed. Patient observed for 30 minutes after procedure without any incident. Patient tolerated procedure well. IV needle removed intact. Food & drink given, VS WNL.  Pt [name] home in stable condition.

## 2020-04-27 ENCOUNTER — Encounter: Payer: Self-pay | Admitting: Gastroenterology

## 2020-05-10 ENCOUNTER — Ambulatory Visit (HOSPITAL_COMMUNITY)
Admission: RE | Admit: 2020-05-10 | Discharge: 2020-05-10 | Disposition: A | Discharge status: Home / Self Care | Payer: BC Managed Care – PPO | Source: Ambulatory Visit | Attending: Hematology | Admitting: Hematology

## 2020-05-10 ENCOUNTER — Other Ambulatory Visit: Payer: Self-pay

## 2020-05-10 DIAGNOSIS — R7989 Other specified abnormal findings of blood chemistry: Secondary | ICD-10-CM | POA: Diagnosis not present

## 2020-05-10 DIAGNOSIS — K769 Liver disease, unspecified: Secondary | ICD-10-CM | POA: Insufficient documentation

## 2020-05-10 DIAGNOSIS — K760 Fatty (change of) liver, not elsewhere classified: Secondary | ICD-10-CM | POA: Diagnosis not present

## 2020-05-10 MED ORDER — GADOBUTROL 1 MMOL/ML IV SOLN
8.0000 mL | Freq: Once | INTRAVENOUS | Status: AC | PRN
  Administered 2020-05-10: 8 mL via INTRAVENOUS

## 2020-05-21 ENCOUNTER — Other Ambulatory Visit: Payer: Self-pay | Admitting: Hematology

## 2020-05-21 ENCOUNTER — Inpatient Hospital Stay: Payer: BC Managed Care – PPO | Attending: Hematology

## 2020-05-21 ENCOUNTER — Other Ambulatory Visit: Payer: Self-pay

## 2020-05-21 ENCOUNTER — Inpatient Hospital Stay: Payer: BC Managed Care – PPO

## 2020-05-21 ENCOUNTER — Other Ambulatory Visit: Payer: Self-pay | Admitting: *Deleted

## 2020-05-21 DIAGNOSIS — R7989 Other specified abnormal findings of blood chemistry: Secondary | ICD-10-CM

## 2020-05-21 LAB — CBC WITH DIFFERENTIAL (CANCER CENTER ONLY)
Abs Immature Granulocytes: 0.01 10*3/uL (ref 0.00–0.07)
Basophils Absolute: 0.1 10*3/uL (ref 0.0–0.1)
Basophils Relative: 1 %
Eosinophils Absolute: 0.2 10*3/uL (ref 0.0–0.5)
Eosinophils Relative: 2 %
HCT: 39.9 % (ref 39.0–52.0)
Hemoglobin: 13 g/dL (ref 13.0–17.0)
Immature Granulocytes: 0 %
Lymphocytes Relative: 48 %
Lymphs Abs: 3.7 10*3/uL (ref 0.7–4.0)
MCH: 25.7 pg — ABNORMAL LOW (ref 26.0–34.0)
MCHC: 32.6 g/dL (ref 30.0–36.0)
MCV: 78.9 fL — ABNORMAL LOW (ref 80.0–100.0)
Monocytes Absolute: 0.3 10*3/uL (ref 0.1–1.0)
Monocytes Relative: 4 %
Neutro Abs: 3.6 10*3/uL (ref 1.7–7.7)
Neutrophils Relative %: 45 %
Platelet Count: 236 10*3/uL (ref 150–400)
RBC: 5.06 MIL/uL (ref 4.22–5.81)
RDW: 13.6 % (ref 11.5–15.5)
WBC Count: 7.8 10*3/uL (ref 4.0–10.5)
nRBC: 0 % (ref 0.0–0.2)

## 2020-05-21 LAB — HIV ANTIBODY (ROUTINE TESTING W REFLEX): HIV Screen 4th Generation wRfx: NONREACTIVE

## 2020-05-21 LAB — HEPATITIS B CORE ANTIBODY, TOTAL: Hep B Core Total Ab: NONREACTIVE

## 2020-05-21 LAB — HEPATITIS C ANTIBODY: HCV Ab: NONREACTIVE

## 2020-05-21 LAB — HEPATITIS B SURFACE ANTIGEN: Hepatitis B Surface Ag: NONREACTIVE

## 2020-05-21 NOTE — Progress Notes (Signed)
Jared Galvan presents today for phlebotomy per MD orders.  Phlebotomy procedure started at 0957 and ended at 1001.  511 grams removed via 16g phlebotomy kit.  Patient observed for 30 minutes after procedure without any incident.  Patient tolerated procedure well.  IV needle removed intact.  Patient accepted beverage but declined snack.

## 2020-05-21 NOTE — Patient Instructions (Signed)
Hemochromatosis  Hemochromatosis is a condition in which the body stores too much iron. This is also called iron storage disease or iron overload disorder. The extra iron builds up in your joints, heart, liver, pancreas, and other organs, where it can cause damage.  There are two forms of this condition; they include:   Hereditary hemochromatosis. Defects (mutations) on certain genes can cause symptoms to develop. With this type of the condition, the body absorbs more iron than it needs from the foods you eat.   Secondary hemochromatosis. With this type of the condition, iron builds up in the body due to other reasons, such as from liver disease or blood transfusions.  What are the causes?  This condition may be caused by:   Abnormal genes passed down from both parents (inherited).   Receiving blood from a donor (blood transfusion).   Problems with the way the body uses iron in the bone marrow (ineffective erythropoiesis).   Having chronic liver disease, such as hepatitis or liver cancer.  What increases the risk?  You are more likely to develop this condition if you:   Inherit certain abnormal gene mutations from both parents.   Are white (Caucasian).   Have severe or long-term (chronic) anemia.  What are the signs or symptoms?  Signs and symptoms can start at any age, but they usually start in middle age. They may include:   Fatigue.   Weakness.   Joint pain and stiffness.   Abdominal pain.   Weight loss.   Skin turning a gray or bronze color.   Loss of interest in sex.   Loss of menstrual periods, in women.   Loss of body hair.   Shortness of breath.  As hemochromatosis gets worse, it may damage the liver, heart, or pancreas. This may lead to complications such as:   Diabetes.   Liver cancer.   Abnormal heart rhythms.   Heart failure.  How is this diagnosed?  This condition may be diagnosed based on:   Your symptoms and medical history.   A physical exam.   Blood tests.   Genetic  testing.   Removal and testing of a sample of liver tissue (liver biopsy).  How is this treated?  This condition is most often treated with:   Therapeutic phlebotomy. In this procedure, some of your blood is removed periodically in order to reduce the amount of iron in your body. Over time, your body will naturally replace the blood cells that you lose during phlebotomy. At the start of treatment, you may have a unit of blood removed once or twice a week. You will have blood tests during this time to determine when your iron levels return to normal. Once your iron levels are normal, you may only need to have a phlebotomy every few months.   Lifestyle changes. This may include not drinking alcohol and limiting certain items in your diet.   Medicines to remove excess iron (chelation therapy).   Medicines to help treat any related conditions.   Genetic counseling. If you are found to have a gene mutation, other family members may need to be tested for hereditary hemochromatosis.  Follow these instructions at home:  Alcohol use     If you have liver damage, do not drink alcohol.   If you do not have liver damage:  ? Do not drink alcohol if:   Your health care provider tells you not to drink.   You are pregnant, may be pregnant, or are   of wine (5 oz), or one shot of hard liquor (1 oz). General instructions  Stay active. Exercise for at least 30 minutes on most days of the week.  Take over-the-counter and prescription medicines only as told by your health care provider. This includes vitamins and supplements.  You may need to have frequent blood or urine testing to monitor your condition and to look for complications.  Follow any  instructions as directed by your health care provider regarding dietary restrictions.  Do not: ? Take vitamins or supplements that contain iron. ? Take vitamin C supplements. Vitamin C makes your body absorb more iron from foods. ? Eat raw shellfish or raw fish. Hemochromatosis may increase your chance for infection from these foods.  Keep all follow-up visits as told by your health care provider. This is important. Contact a health care provider if you have:  Fatigue.  Unusual weakness.  Shortness of breath.  Joint pain.  Abdominal pain.  Weight loss. Get help right away if you have:  Chest pain.  Trouble breathing. Summary  Hemochromatosis is a condition in which your body stores too much iron. This is also called iron storage disease or iron overload disorder.  The extra iron builds up in your joints, heart, liver, pancreas, and other organs, where it can cause damage.  Signs and symptoms can start at any age, but they usually start in middle age.  To treat this condition, you will need to have some of your blood removed periodically (therapeutic phlebotomy). Removing some of your blood also removes iron from your body.  You may need to have frequent blood or urine testing to monitor your condition and to look for complications. This information is not intended to replace advice given to you by your health care provider. Make sure you discuss any questions you have with your health care provider. Document Revised: 09/19/2017 Document Reviewed: 09/19/2017 Elsevier Patient Education  [place] After This sheet gives you information about how to care for yourself after your procedure. Your health care provider may also give you more specific instructions. If you have problems or questions, contact your health care provider. What can I expect after the procedure? After the procedure, it is common to have:  Light-headedness or  dizziness. You may feel faint.  Nausea.  Tiredness (fatigue). Follow these instructions at home: Eating and drinking  Be sure to eat well-balanced meals for the next 24 hours.  Drink enough fluid to keep your urine pale yellow.  Avoid drinking alcohol on the day that you had the procedure. Activity   Return to your normal activities as told by your health care provider. Most people can go back to their normal activities right away.  Avoid activities that take a lot of effort for about 5 hours after the procedure. Athletes should avoid strenuous exercise for at least 12 hours.  Avoid heavy lifting or pulling for about 5 hours after the procedure. Do not lift anything that is heavier than 10 lb (4.5 kg).  Change positions slowly for the remainder of the day. This will help to prevent light-headedness or fainting.  If you feel light-headed, lie down until the feeling goes away. Needle insertion site care   Keep your bandage (dressing) dry. You can remove the bandage after about 5 hours or as told by your health care provider.  If you have bleeding from the needle insertion site, raise (elevate) your arm and press firmly on the site until  the bleeding stops.  If you have bruising at the site, apply ice to the area: ? Remove the dressing. ? Put ice in a plastic bag. ? Place a towel between your skin and the bag. ? Leave the ice on for 20 minutes, 2-3 times a day for the first 24 hours.  If the swelling does not go away after 24 hours, apply a warm, moist cloth (warm compress) to the area for 20 minutes, 2-3 times a day. General instructions  Do not use any products that contain nicotine or tobacco, such as cigarettes and e-cigarettes, for at least 30 minutes after the procedure.  Keep all follow-up visits as told by your health care provider. This is important. You may need to continue having regular therapeutic phlebotomy treatments as directed. Contact a health care provider  if you:  Have redness, swelling, or pain at the needle insertion site.  Have fluid or blood coming from the needle insertion site.  Have pus or a bad smell coming from the needle insertion site.  Notice that the needle insertion site feels warm to the touch.  Feel light-headed, dizzy, or nauseous, and the feeling does not go away.  Have new bruising at the needle insertion site.  Feel weaker than normal.  Have a fever or chills. Get help right away if:  You faint.  You have chest pain.  You have trouble breathing.  You have severe nausea or vomiting. Summary  After the procedure, it is common to have some light-headedness, dizziness, nausea, or tiredness (fatigue).  Be sure to eat well-balanced meals for the next 24 hours. Drink enough fluid to keep your urine pale yellow.  Return to your normal activities as told by your health care provider.  Keep all follow-up visits as told by your health care provider. You may need to continue having regular therapeutic phlebotomy treatments as directed. This information is not intended to replace advice given to you by your health care provider. Make sure you discuss any questions you have with your health care provider. Document Revised: 09/28/2017 Document Reviewed: 09/27/2017 Elsevier Patient Education  [place].

## 2020-05-22 LAB — AFP TUMOR MARKER: AFP, Serum, Tumor Marker: 4.2 ng/mL (ref 0.0–8.3)

## 2020-05-26 LAB — HEMOCHROMATOSIS DNA-PCR(C282Y,H63D)

## 2020-06-18 ENCOUNTER — Inpatient Hospital Stay: Payer: BC Managed Care – PPO

## 2020-06-18 ENCOUNTER — Other Ambulatory Visit: Payer: Self-pay

## 2020-06-18 ENCOUNTER — Other Ambulatory Visit: Payer: Self-pay | Admitting: *Deleted

## 2020-06-18 ENCOUNTER — Inpatient Hospital Stay: Payer: BC Managed Care – PPO | Attending: Hematology

## 2020-06-18 DIAGNOSIS — R7989 Other specified abnormal findings of blood chemistry: Secondary | ICD-10-CM

## 2020-06-18 LAB — CBC WITH DIFFERENTIAL (CANCER CENTER ONLY)
Abs Immature Granulocytes: 0.01 10*3/uL (ref 0.00–0.07)
Basophils Absolute: 0 10*3/uL (ref 0.0–0.1)
Basophils Relative: 1 %
Eosinophils Absolute: 0.3 10*3/uL (ref 0.0–0.5)
Eosinophils Relative: 3 %
HCT: 40.3 % (ref 39.0–52.0)
Hemoglobin: 13.1 g/dL (ref 13.0–17.0)
Immature Granulocytes: 0 %
Lymphocytes Relative: 43 %
Lymphs Abs: 3.6 10*3/uL (ref 0.7–4.0)
MCH: 25.6 pg — ABNORMAL LOW (ref 26.0–34.0)
MCHC: 32.5 g/dL (ref 30.0–36.0)
MCV: 78.7 fL — ABNORMAL LOW (ref 80.0–100.0)
Monocytes Absolute: 0.5 10*3/uL (ref 0.1–1.0)
Monocytes Relative: 6 %
Neutro Abs: 4 10*3/uL (ref 1.7–7.7)
Neutrophils Relative %: 47 %
Platelet Count: 243 10*3/uL (ref 150–400)
RBC: 5.12 MIL/uL (ref 4.22–5.81)
RDW: 13.6 % (ref 11.5–15.5)
WBC Count: 8.5 10*3/uL (ref 4.0–10.5)
nRBC: 0 % (ref 0.0–0.2)

## 2020-06-18 LAB — CMP (CANCER CENTER ONLY)
ALT: 100 U/L — ABNORMAL HIGH (ref 0–44)
AST: 55 U/L — ABNORMAL HIGH (ref 15–41)
Albumin: 4 g/dL (ref 3.5–5.0)
Alkaline Phosphatase: 75 U/L (ref 38–126)
Anion gap: 10 (ref 5–15)
BUN: 22 mg/dL — ABNORMAL HIGH (ref 6–20)
CO2: 25 mmol/L (ref 22–32)
Calcium: 9.6 mg/dL (ref 8.9–10.3)
Chloride: 104 mmol/L (ref 98–111)
Creatinine: 0.84 mg/dL (ref 0.61–1.24)
GFR, Est AFR Am: >60 mL/min (ref >60)
GFR, Estimated: >60 mL/min (ref >60)
Glucose, Bld: 96 mg/dL (ref 70–99)
Potassium: 4.1 mmol/L (ref 3.5–5.1)
Sodium: 139 mmol/L (ref 135–145)
Total Bilirubin: 0.3 mg/dL (ref 0.3–1.2)
Total Protein: 7.6 g/dL (ref 6.5–8.1)

## 2020-06-18 LAB — IRON AND TIBC
Iron: 46 ug/dL (ref 42–163)
Saturation Ratios: 12 % — ABNORMAL LOW (ref 20–55)
TIBC: 393 ug/dL (ref 202–409)
UIBC: 346 ug/dL (ref 117–376)

## 2020-06-18 LAB — FERRITIN: Ferritin: 520 ng/mL — ABNORMAL HIGH (ref 24–336)

## 2020-06-18 NOTE — Patient Instructions (Signed)

## 2020-06-18 NOTE — Progress Notes (Signed)
Jared Galvan presents today for phlebotomy per MD orders. Phlebotomy procedure started at 0945 via 18G to R AC and ended at 0953. 524 grams removed. IV needle removed intact. Patient tolerated procedure well; offered food and drink. Patient observed for 30 minutes after procedure without any incident.

## 2020-06-25 ENCOUNTER — Encounter: Payer: Self-pay | Admitting: Gastroenterology

## 2020-06-25 ENCOUNTER — Other Ambulatory Visit (INDEPENDENT_AMBULATORY_CARE_PROVIDER_SITE_OTHER): Payer: BC Managed Care – PPO

## 2020-06-25 ENCOUNTER — Ambulatory Visit (INDEPENDENT_AMBULATORY_CARE_PROVIDER_SITE_OTHER): Payer: BC Managed Care – PPO | Admitting: Gastroenterology

## 2020-06-25 VITALS — BP 130/62 | HR 74 | Ht 63.5 in | Wt 181.0 lb

## 2020-06-25 DIAGNOSIS — R7989 Other specified abnormal findings of blood chemistry: Secondary | ICD-10-CM

## 2020-06-25 DIAGNOSIS — R945 Abnormal results of liver function studies: Secondary | ICD-10-CM | POA: Diagnosis not present

## 2020-06-25 DIAGNOSIS — K760 Fatty (change of) liver, not elsewhere classified: Secondary | ICD-10-CM

## 2020-06-25 DIAGNOSIS — Z1211 Encounter for screening for malignant neoplasm of colon: Secondary | ICD-10-CM

## 2020-06-25 DIAGNOSIS — R933 Abnormal findings on diagnostic imaging of other parts of digestive tract: Secondary | ICD-10-CM | POA: Diagnosis not present

## 2020-06-25 DIAGNOSIS — R1319 Other dysphagia: Secondary | ICD-10-CM | POA: Diagnosis not present

## 2020-06-25 LAB — PROTIME-INR
INR: 1 ratio (ref 0.8–1.0)
Prothrombin Time: 10.9 s (ref 9.6–13.1)

## 2020-06-25 NOTE — Progress Notes (Signed)
[facility] Consult Note:  History: Jared Galvan 06/25/2020  Referring provider: Dr Irene Limbo (hematology)  Reason for consult/chief complaint: Elevated Hepatic Enzymes (elevated liver enzymes with hepatic steatosis. Also, possible liver lesion on MRI. ) and Abdominal Pain (Patient reports some left sided and right sided abd pain after meals. Pt states it does not happen all the time)   Subjective  HPI: This patient was referred by his hematologist for elevated LFTs in the setting of markedly elevated ferritin.  Consult  and follow up notes with that clinic were reviewed.  He has undergone phlebotomy as recently as last week. Jared Galvan is a 49 year old [place] man (who speaks English well) here for evaluation of the above issues.  He was unaware of any liver issues until he was referred to Dr. Irene Limbo by a primary health provider at his job.  He feels well, sometimes feels bloated if he eats too large meal.  His bowel habits are regular without rectal bleeding, and he denies nausea, vomiting, early satiety or weight loss.  It is difficult to be certain, but it sounds like he might intermittently feel food briefly stuck in the lower chest.  This has been going on intermittently for years.  No known family history of digestive malignancies.  His mother died of some kind of cancer that may have been pelvic.  Jared Galvan has had 3 sessions of phlebotomy so far and he has interested to learn more of his liver condition and imaging abnormalities today.   ROS:  Review of Systems  Constitutional: Negative for appetite change and unexpected weight change.  HENT: Negative for mouth sores and voice change.   Eyes: Negative for pain and redness.  Respiratory: Negative for cough and shortness of breath.   Cardiovascular: Negative for chest pain and palpitations.  Genitourinary: Negative for dysuria and hematuria.  Musculoskeletal: Negative for arthralgias and myalgias.  Skin: Negative for  pallor and rash.  Neurological: Negative for weakness and headaches.  Hematological: Negative for adenopathy.     Past Medical History: Past Medical History:  Diagnosis Date  . Allergic rhinitis   . Microcytic anemia      Past Surgical History: History reviewed. No pertinent surgical history.   Family History: Family History  Problem Relation Age of Onset  . Cancer Mother 71       uterine    Social History: Social History   Socioeconomic History  . Marital status: Married    Spouse name: Not on file  . Number of children: 1  . Years of education: 37  . Highest education level: Not on file  Occupational History  . Occupation: chemical  Tobacco Use  . Smoking status: Never Smoker  . Smokeless tobacco: Never Used  Substance and Sexual Activity  . Alcohol use: No  . Drug use: No  . Sexual activity: Yes    Partners: Female  Other Topics Concern  . Not on file  Social History Narrative   From [place].  Came to the [place] in 1992. Lives with his wife and their daughter.  His brother also lives with them.   Social Determinants of Health   Financial Resource Strain:   . Difficulty of Paying Living Expenses: Not on file  Food Insecurity:   . Worried About Charity fundraiser in the Last Year: Not on file  . Ran Out of Food in the Last Year: Not on file  Transportation Needs:   . Lack of Transportation (Medical): Not on file  .  Lack of Transportation (Non-Medical): Not on file  Physical Activity:   . Days of Exercise per Week: Not on file  . Minutes of Exercise per Session: Not on file  Stress:   . Feeling of Stress : Not on file  Social Connections:   . Frequency of Communication with Friends and Family: Not on file  . Frequency of Social Gatherings with Friends and Family: Not on file  . Attends Religious Services: Not on file  . Active Member of Clubs or Organizations: Not on file  . Attends Archivist Meetings: Not on file  . Marital Status: Not on  file    Allergies: No Known Allergies  Outpatient Meds: Current Outpatient Medications  Medication Sig Dispense Refill  . Probiotic Product (PROBIOTIC-10 PO) Take 1 tablet by mouth daily.    . rosuvastatin (CRESTOR) 5 MG tablet Take 5 mg by mouth daily.     No current facility-administered medications for this visit.      ___________________________________________________________________ Objective   Exam:  BP 130/62   Pulse 74   Ht 5' 3.5" (1.613 m)   Wt 181 lb (82.1 kg)   BMI 31.56 kg/m  His wife was present for the entire visit (she is non-English-speaking)  General: Well-appearing  Eyes: sclera anicteric, no redness  ENT: oral mucosa moist without lesions, no cervical or supraclavicular lymphadenopathy  CV: RRR without murmur, S1/S2, no JVD, no peripheral edema  Resp: clear to auscultation bilaterally, normal RR and effort noted  GI: soft, no tenderness, with active bowel sounds. No guarding or palpable organomegaly noted.  Skin; warm and dry, no rash or jaundice noted  Neuro: awake, alert and oriented x 3. Normal gross motor function and fluent speech  Labs:  CBC Latest Ref Rng & Units 06/18/2020 05/21/2020 04/23/2020  WBC 4.0 - 10.5 K/uL 8.5 7.8 8.6  Hemoglobin 13.0 - 17.0 g/dL 13.1 13.0 14.0  Hematocrit 39 - 52 % 40.3 39.9 42.4  Platelets 150 - 400 K/uL 243 236 237   CMP Latest Ref Rng & Units 06/18/2020 04/23/2020 04/05/2020  Glucose 70 - 99 mg/dL 96 145(H) 109(H)  BUN 6 - 20 mg/dL 22(H) 24(H) 25(H)  Creatinine 0.61 - 1.24 mg/dL 0.84 1.02 1.02  Sodium 135 - 145 mmol/L 139 138 140  Potassium 3.5 - 5.1 mmol/L 4.1 4.0 3.8  Chloride 98 - 111 mmol/L 104 105 106  CO2 22 - 32 mmol/L 25 23 25   Calcium 8.9 - 10.3 mg/dL 9.6 10.6(H) 9.4  Total Protein 6.5 - 8.1 g/dL 7.6 7.6 7.1  Total Bilirubin 0.3 - 1.2 mg/dL 0.3 0.7 1.1  Alkaline Phos 38 - 126 U/L 75 68 65  AST 15 - 41 U/L 55(H) 97(H) 62(H)  ALT 0 - 44 U/L 100(H) 181(H) 113(H)    LFTs in 2016 and 2017  notable only for ALT 50-53, otherwise normal  Last AFP 4.2 on 05/21/2020  Hepatitis C antibody negative in 2016 and again last month Hepatitis B core antibody and surface antigen both recently negative HIV negative  HFE gene mutation testing in July and again in August both negative for any identifiable mutations.  Iron/TIBC/Ferritin/ %Sat    Component Value Date/Time   IRON 46 06/18/2020 0901   TIBC 393 06/18/2020 0901   FERRITIN 520 (H) 06/18/2020 0901   IRONPCTSAT 12 (L) 06/18/2020 0901   IRONPCTSAT 18 09/13/2015 1436   Ferritin was 485 in 2016, 520 in 2017, 793 in July of this year, then 1558 later in July this  year  Radiologic Studies:  CLINICAL DATA:  Abnormal liver function test.   EXAM: ABDOMEN ULTRASOUND COMPLETE   COMPARISON:  09/13/2015 abdominal radiograph.   FINDINGS: Gallbladder: No gallstones or wall thickening visualized. Gallbladder polyps measuring up to 5.4 mm. No sonographic Murphy sign noted by sonographer.   Common bile duct: Diameter: 5.3 mm, within normal limits.   Liver: 1.4 x 1.1 x 1.6 cm hypoechoic lesion. Increased parenchymal echogenicity. Portal vein is patent on color Doppler imaging with normal direction of blood flow towards the liver.   IVC: No abnormality visualized.   Pancreas: Visualized portion unremarkable.   Spleen: Size and appearance within normal limits.   Right Kidney: Length: 11.8 cm. Echogenicity within normal limits. No mass or hydronephrosis visualized.   Left Kidney: Length: 12.0 cm. Echogenicity within normal limits. No mass or hydronephrosis visualized.   Abdominal aorta: No aneurysm visualized.   Other findings: None.   IMPRESSION: Hepatic steatosis. Nonspecific 1.6 cm right hepatic hypoechoic focus. Differential includes focal fatty sparing however alternative pathologies cannot be excluded. Consider MRI abdomen with and without contrast for further evaluation.   Gallbladder polyps.       Electronically Signed   By: Primitivo Gauze M.D.   On: 04/19/2020 11:13 ______________________________________________  CLINICAL DATA:  Elevated liver function test. 1.6 cm right hepatic lesion on ultrasound.   EXAM: MRI ABDOMEN WITHOUT AND WITH CONTRAST   TECHNIQUE: Multiplanar multisequence MR imaging of the abdomen was performed both before and after the administration of intravenous contrast.   CONTRAST:  34mL GADAVIST GADOBUTROL 1 MMOL/ML IV SOLN   COMPARISON:  04/20/2027 abdominal ultrasound   FINDINGS: Moderate motion degradation throughout. The patient had difficulty with breath holds, secondary to language barrier.   Lower chest: Normal heart size without pericardial or pleural effusion.   Hepatobiliary: No cirrhosis. Probable hepatic steatosis, suboptimally evaluated. No T2 or diffusion-weighted correlate for the ultrasound abnormality. Postcontrast dynamic images are significantly motion degraded, without gross abnormality.   On some series, there is gallbladder wall irregularity, including at 4 mm on 57/8 and 23/25. No acute cholecystitis or biliary duct dilatation.   Pancreas:  Normal, without mass or ductal dilatation.   Spleen:  Normal in size, without focal abnormality.   Adrenals/Urinary Tract: Normal adrenal glands. Normal right kidney. Tiny upper pole left renal T2 hyperintense lesion is likely a cyst at 4 mm. No hydronephrosis.   Stomach/Bowel: Normal stomach and abdominal bowel loops.   Vascular/Lymphatic: Normal aortic caliber. No abdominal adenopathy.   Other:  No ascites.   Musculoskeletal: No acute osseous abnormality.   IMPRESSION: 1. Moderate motion degradation, as detailed above. 2. Probable hepatic steatosis. Given above limitations, no suspicious the liver lesion identified. Given appearance/location on ultrasound, and absence of worrisome finding on the current exam, focal sparing of steatosis is strongly favored. 3. Subtle  gallbladder wall irregularity, likely corresponding to small polyps detailed on ultrasound. 4. No biliary duct dilatation of the explanation for elevated liver function tests.     Electronically Signed   By: Abigail Miyamoto M.D.   On: 05/10/2020 14:50   Assessment: Encounter Diagnoses  Name Primary?  Marland Kitchen LFTs abnormal Yes  . NAFLD (nonalcoholic fatty liver disease)   . Abnormal finding on GI tract imaging   . Esophageal dysphagia   . Special screening for malignant neoplasms, colon      Elevated transaminases that may be from severe fatty liver and possibly with steatohepatitis with secondary iron overload, though autoimmune condition should be considered.  No  identified HFE mutations, must still consider hemochromatosis/primary iron overload. The initial ultrasound suggested a liver lesion, though none seen on MRI with limitation of some motion degradation artifact.  Suspected to be focal fatty sparing.  aFP normal. Negative for hepatitis B and C virus.  He is on a statin prescribed by his PCP at work, and that along with his obesity increased risk for metabolic associated liver disease (formerly known as NAFLD)  Even if this is fatty liver, I am concerned that he has significant steatohepatitis and possibly fibrosis.  If so, he might need dedicated hepatology evaluation for consideration of clinical trial, especially given the degree of secondary iron overload it has caused.  Plan:  Autoimmune liver disease antibodies  Liver biopsy.  Referral sent to interventional radiology.  I will follow up with him after that to review results.  When this is attended to, we can then talk about screening colonoscopy and upper endoscopy for his intermittent dysphagia.  Thank you for the courtesy of this consult.  Please call me with any questions or concerns.  Nelida Meuse III  CC: Referring provider noted above

## 2020-06-25 NOTE — Patient Instructions (Addendum)
If you are age 49 or older, your body mass index should be between 23-30. Your Body mass index is 31.56 kg/m. If this is out of the aforementioned range listed, please consider follow up with your Primary Care Provider.  If you are age 32 or younger, your body mass index should be between 19-25. Your Body mass index is 31.56 kg/m. If this is out of the aformentioned range listed, please consider follow up with your Primary Care Provider.   Your provider has requested that you go to the basement level for lab work before leaving today. Press "B" on the elevator. The lab is located at the first door on the left as you exit the elevator.  Due to recent changes in healthcare laws, you may see the results of your imaging and laboratory studies on MyChart before your provider has had a chance to review them.  We understand that in some cases there may be results that are confusing or concerning to you. Not all laboratory results come back in the same time frame and the provider may be waiting for multiple results in order to interpret others.  Please give [place] 48 hours in order for your provider to thoroughly review all the results before contacting the office for clarification of your results.   You have been scheduled for a liver biopsy on 07-08-2020 at 1:00pm. Please arrive at 11:15am to [facility]. Nothing to eat or drink after 7:00am on 07-08-2020. You must have a driver and a care giver home with you over night.    It was a pleasure to see you today!  Dr. [name]

## 2020-06-29 LAB — ANA: Anti Nuclear Antibody (ANA): NEGATIVE

## 2020-06-29 LAB — MITOCHONDRIAL ANTIBODIES: Mitochondrial M2 Ab, IgG: <=20 U

## 2020-06-29 LAB — ANTI-SMOOTH MUSCLE ANTIBODY, IGG: Actin (Smooth Muscle) Antibody (IGG): <20 U (ref <20)

## 2020-07-07 ENCOUNTER — Other Ambulatory Visit: Payer: Self-pay | Admitting: Physician Assistant

## 2020-07-07 ENCOUNTER — Other Ambulatory Visit: Payer: Self-pay | Admitting: Student

## 2020-07-08 ENCOUNTER — Encounter (HOSPITAL_COMMUNITY): Payer: Self-pay

## 2020-07-08 ENCOUNTER — Ambulatory Visit (HOSPITAL_COMMUNITY)
Admission: RE | Admit: 2020-07-08 | Discharge: 2020-07-08 | Disposition: A | Discharge status: Home / Self Care | Payer: BC Managed Care – PPO | Source: Ambulatory Visit | Attending: Gastroenterology | Admitting: Gastroenterology

## 2020-07-08 ENCOUNTER — Other Ambulatory Visit: Payer: Self-pay

## 2020-07-08 DIAGNOSIS — R933 Abnormal findings on diagnostic imaging of other parts of digestive tract: Secondary | ICD-10-CM | POA: Diagnosis not present

## 2020-07-08 DIAGNOSIS — R7989 Other specified abnormal findings of blood chemistry: Secondary | ICD-10-CM | POA: Insufficient documentation

## 2020-07-08 DIAGNOSIS — Z1211 Encounter for screening for malignant neoplasm of colon: Secondary | ICD-10-CM | POA: Insufficient documentation

## 2020-07-08 DIAGNOSIS — R945 Abnormal results of liver function studies: Secondary | ICD-10-CM | POA: Insufficient documentation

## 2020-07-08 DIAGNOSIS — R1319 Other dysphagia: Secondary | ICD-10-CM | POA: Insufficient documentation

## 2020-07-08 DIAGNOSIS — K7581 Nonalcoholic steatohepatitis (NASH): Secondary | ICD-10-CM | POA: Diagnosis not present

## 2020-07-08 DIAGNOSIS — K760 Fatty (change of) liver, not elsewhere classified: Secondary | ICD-10-CM

## 2020-07-08 DIAGNOSIS — K7401 Hepatic fibrosis, early fibrosis: Secondary | ICD-10-CM | POA: Diagnosis not present

## 2020-07-08 LAB — CBC WITH DIFFERENTIAL/PLATELET
Abs Immature Granulocytes: 0.01 10*3/uL (ref 0.00–0.07)
Basophils Absolute: 0.1 10*3/uL (ref 0.0–0.1)
Basophils Relative: 1 %
Eosinophils Absolute: 0.2 10*3/uL (ref 0.0–0.5)
Eosinophils Relative: 3 %
HCT: 40.8 % (ref 39.0–52.0)
Hemoglobin: 13.4 g/dL (ref 13.0–17.0)
Immature Granulocytes: 0 %
Lymphocytes Relative: 45 %
Lymphs Abs: 3.7 10*3/uL (ref 0.7–4.0)
MCH: 26.3 pg (ref 26.0–34.0)
MCHC: 32.8 g/dL (ref 30.0–36.0)
MCV: 80 fL (ref 80.0–100.0)
Monocytes Absolute: 0.4 10*3/uL (ref 0.1–1.0)
Monocytes Relative: 5 %
Neutro Abs: 3.8 10*3/uL (ref 1.7–7.7)
Neutrophils Relative %: 46 %
Platelets: 250 10*3/uL (ref 150–400)
RBC: 5.1 MIL/uL (ref 4.22–5.81)
RDW: 13.4 % (ref 11.5–15.5)
WBC: 8.1 10*3/uL (ref 4.0–10.5)
nRBC: 0 % (ref 0.0–0.2)

## 2020-07-08 LAB — PROTIME-INR
INR: 1 (ref 0.8–1.2)
Prothrombin Time: 12.7 seconds (ref 11.4–15.2)

## 2020-07-08 MED ORDER — MIDAZOLAM HCL 2 MG/2ML IJ SOLN
INTRAMUSCULAR | Status: AC
  Filled 2020-07-08: qty 4

## 2020-07-08 MED ORDER — LIDOCAINE HCL (PF) 1 % IJ SOLN
INTRAMUSCULAR | Status: AC | PRN
  Administered 2020-07-08: 10 mL via INTRADERMAL

## 2020-07-08 MED ORDER — FENTANYL CITRATE (PF) 100 MCG/2ML IJ SOLN
INTRAMUSCULAR | Status: AC
  Filled 2020-07-08: qty 2

## 2020-07-08 MED ORDER — LIDOCAINE HCL 1 % IJ SOLN
INTRAMUSCULAR | Status: AC
  Filled 2020-07-08: qty 20

## 2020-07-08 MED ORDER — FENTANYL CITRATE (PF) 100 MCG/2ML IJ SOLN
INTRAMUSCULAR | Status: AC | PRN
  Administered 2020-07-08 (×2): 50 ug via INTRAVENOUS

## 2020-07-08 MED ORDER — GELATIN ABSORBABLE 12-7 MM EX MISC
CUTANEOUS | Status: AC
  Filled 2020-07-08: qty 1

## 2020-07-08 MED ORDER — MIDAZOLAM HCL 2 MG/2ML IJ SOLN
INTRAMUSCULAR | Status: AC | PRN
  Administered 2020-07-08: 2 mg via INTRAVENOUS

## 2020-07-08 MED ORDER — GELATIN ABSORBABLE 12-7 MM EX MISC
CUTANEOUS | Status: AC | PRN
  Administered 2020-07-08: 1 via TOPICAL

## 2020-07-08 NOTE — H&P (Signed)
Chief Complaint: Patient was seen in consultation today for elevated LFTs/random liver biopsy.  Referring Physician(s): Nelida Meuse III  Supervising Physician: Aletta Edouard  Patient Status: [facility] - Out-pt  History of Present Illness: Jared Galvan is a 49 y.o. male with a past medical history of anemia and seasonal allergies. He was found to have elevated LFTs on routine blood work, and was referred to hematology by his PCP for further management. MR abdomen did not show any identifiable liver lesions. Due to persistent LFT elevation without know etiology, he was then referred to GI for liver work-up. He was seen by GI who recommended IR consult for possible random liver biopsy for further information.  MR abdomen 05/10/2020: 1. Moderate motion degradation, as detailed above. 2. Probable hepatic steatosis. Given above limitations, no suspicious the liver lesion identified. Given appearance/location on ultrasound, and absence of worrisome finding on the current exam, focal sparing of steatosis is strongly favored. 3. Subtle gallbladder wall irregularity, likely corresponding to small polyps detailed on ultrasound. 4. No biliary duct dilatation of the explanation for elevated liver function tests.  IR consulted by Dr. [name] for possible image-guided random liver biopsy. Patient awake and alert sitting in bed with no complaints at this time. He speaks [place] well and states he does not need an interpretor at this time. Denies fever, chills, chest pain, dyspnea, abdominal pain, or headache.   Past Medical History:  Diagnosis Date  . Allergic rhinitis   . Microcytic anemia     History reviewed. No pertinent surgical history.  Allergies: Patient has no known allergies.  Medications: Prior to Admission medications   Medication Sig Start Date End Date Taking? Authorizing Provider  Probiotic Product (PROBIOTIC-10 PO) Take 1 tablet by mouth daily.    [provider]    rosuvastatin (CRESTOR) 5 MG tablet Take 5 mg by mouth daily.    [provider]     Family History  Problem Relation Age of Onset  . Cancer Mother 66       uterine    Social History   Socioeconomic History  . Marital status: Married    Spouse name: Not on file  . Number of children: 1  . Years of education: 16  . Highest education level: Not on file  Occupational History  . Occupation: chemical  Tobacco Use  . Smoking status: Never Smoker  . Smokeless tobacco: Never Used  Substance and Sexual Activity  . Alcohol use: No  . Drug use: No  . Sexual activity: Yes    Partners: Female  Other Topics Concern  . Not on file  Social History Narrative   From [place].  Came to the [place] in 1992. Lives with his wife and their daughter.  His brother also lives with them.   Social Determinants of Health   Financial Resource Strain:   . Difficulty of Paying Living Expenses: Not on file  Food Insecurity:   . Worried About Charity fundraiser in the Last Year: Not on file  . Ran Out of Food in the Last Year: Not on file  Transportation Needs:   . Lack of Transportation (Medical): Not on file  . Lack of Transportation (Non-Medical): Not on file  Physical Activity:   . Days of Exercise per Week: Not on file  . Minutes of Exercise per Session: Not on file  Stress:   . Feeling of Stress : Not on file  Social Connections:   . Frequency of Communication with  Friends and Family: Not on file  . Frequency of Social Gatherings with Friends and Family: Not on file  . Attends Religious Services: Not on file  . Active Member of Clubs or Organizations: Not on file  . Attends Archivist Meetings: Not on file  . Marital Status: Not on file     Review of Systems: A 12 point ROS discussed and pertinent positives are indicated in the HPI above.  All other systems are negative.  Review of Systems  Constitutional: Negative for chills and fever.  Respiratory: Negative for  shortness of breath and wheezing.   Cardiovascular: Negative for chest pain and palpitations.  Gastrointestinal: Negative for abdominal pain.  Neurological: Negative for headaches.  Psychiatric/Behavioral: Negative for behavioral problems and confusion.    Vital Signs: BP 135/78   Pulse 70   Temp 98.3 F (36.8 C) (Oral)   Resp 18   SpO2 100%   Physical Exam Vitals and nursing note reviewed.  Constitutional:      General: He is not in acute distress.    Appearance: Normal appearance.  Cardiovascular:     Rate and Rhythm: Normal rate and regular rhythm.     Heart sounds: Normal heart sounds. No murmur heard.   Pulmonary:     Effort: Pulmonary effort is normal. No respiratory distress.     Breath sounds: Normal breath sounds. No wheezing.  Skin:    General: Skin is warm and dry.  Neurological:     Mental Status: He is alert and oriented to person, place, and time.      MD Evaluation Airway: WNL Heart: WNL Abdomen: WNL Chest/ Lungs: WNL ASA  Classification: 2 Mallampati/Airway Score: Two   Imaging: No results found.  Labs:  CBC: Recent Labs    04/23/20 0923 05/21/20 0838 06/18/20 0902 07/08/20 1149  WBC 8.6 7.8 8.5 8.1  HGB 14.0 13.0 13.1 13.4  HCT 42.4 39.9 40.3 40.8  PLT 237 236 243 250    COAGS: Recent Labs    06/25/20 1122  INR 1.0    BMP: Recent Labs    04/05/20 1419 04/23/20 0923 06/18/20 0902  NA 140 138 139  K 3.8 4.0 4.1  CL 106 105 104  CO2 25 23 25   GLUCOSE 109* 145* 96  BUN 25* 24* 22*  CALCIUM 9.4 10.6* 9.6  CREATININE 1.02 1.02 0.84  GFRNONAA >60 >60 >60  GFRAA >60 >60 >60    LIVER FUNCTION TESTS: Recent Labs    04/05/20 1419 04/23/20 0923 06/18/20 0902  BILITOT 1.1 0.7 0.3  AST 62* 97* 55*  ALT 113* 181* 100*  ALKPHOS 65 68 75  PROT 7.1 7.6 7.6  ALBUMIN 4.1 4.3 4.0     Assessment and Plan:  Elevated LFTs without known etiology. Plan for image-guided random liver biopsy today in IR. Patient is  NPO. Afebrile. He does not take blood thinners. INR pending.  Risks and benefits discussed with the patient including, but not limited to bleeding, infection, damage to adjacent structures or low yield requiring additional tests. All of the patient's questions were answered, patient is agreeable to proceed. Consent signed and in chart.   Thank you for this interesting consult.  I greatly enjoyed meeting [place] and look forward to participating in their care.  A copy of this report was sent to the requesting provider on this date.  Electronically Signed: Earley Abide, PA-C 07/08/2020, 12:13 PM   I spent a total of 30 Minutes in face  to face in clinical consultation, greater than 50% of which was counseling/coordinating care for elevated LFTs/random liver biopsy.

## 2020-07-08 NOTE — Discharge Instructions (Signed)
Please call Interventional Radiology clinic [phone] with any questions or concerns.  You may remove your dressing and shower tomorrow.  Liver Biopsy, Care After These instructions give you information on caring for yourself after your procedure. Your doctor may also give you more specific instructions. Call your doctor if you have any problems or questions after your procedure. What can I expect after the procedure? After the procedure, it is common to have:  Pain and soreness where the biopsy was done.  Bruising around the area where the biopsy was done.  Sleepiness and be tired for a few days. Follow these instructions at home: Medicines  Take over-the-counter and prescription medicines only as told by your doctor.  If you were prescribed an antibiotic medicine, take it as told by your doctor. Do not stop taking the antibiotic even if you start to feel better.  Do not take medicines such as aspirin and ibuprofen. These medicines can thin your blood. Do not take these medicines unless your doctor tells you to take them.  If you are taking prescription pain medicine, take actions to prevent or treat constipation. Your doctor may recommend that you: ? Drink enough fluid to keep your pee (urine) clear or pale yellow. ? Take over-the-counter or prescription medicines. ? Eat foods that are high in fiber, such as fresh fruits and vegetables, whole grains, and beans. ? Limit foods that are high in fat and processed sugars, such as fried and sweet foods. Caring for your cut  Follow instructions from your doctor about how to take care of your cuts from surgery (incisions). Make sure you: ? Wash your hands with soap and water before you change your bandage (dressing). If you cannot use soap and water, use hand sanitizer. ? Change your bandage as told by your doctor. ? Leave stitches (sutures), skin glue, or skin tape (adhesive) strips in place. They may need to stay in place for 2 weeks  or longer. If tape strips get loose and curl up, you may trim the loose edges. Do not remove tape strips completely unless your doctor says it is okay.  Check your cuts every day for signs of infection. Check for: ? Redness, swelling, or more pain. ? Fluid or blood. ? Pus or a bad smell. ? Warmth.  Do not take baths, swim, or use a hot tub until your doctor says it is okay to do so. Activity   Rest at home for 1-2 days or as told by your doctor. ? Avoid sitting for a long time without moving. Get up to take short walks every 1-2 hours.  Return to your normal activities as told by your doctor. Ask what activities are safe for you.  Do not do these things in the first 24 hours: ? Drive. ? Use machinery. ? Take a bath or shower.  Do not lift more than 10 pounds (4.5 kg) or play contact sports for the first 2 weeks. General instructions   Do not drink alcohol in the first week after the procedure.  Have someone stay with you for at least 24 hours after the procedure.  Get your test results. Ask your doctor or the department that is doing the test: ? When will my results be ready? ? How will I get my results? ? What are my treatment options? ? What other tests do I need? ? What are my next steps?  Keep all follow-up visits as told by your doctor. This is important. Contact a doctor if:    A cut bleeds and leaves more than just a small spot of blood.  A cut is red, puffs up (swells), or hurts more than before.  Fluid or something else comes from a cut.  A cut smells bad.  You have a fever or chills. Get help right away if:  You have swelling, bloating, or pain in your belly (abdomen).  You get dizzy or faint.  You have a rash.  You feel sick to your stomach (nauseous) or throw up (vomit).  You have trouble breathing, feel short of breath, or feel faint.  Your chest hurts.  You have problems talking or seeing.  You have trouble with your balance or moving your  arms or legs. Summary  After the procedure, it is common to have pain, soreness, bruising, and tiredness.  Your doctor will tell you how to take care of yourself at home. Change your bandage, take your medicines, and limit your activities as told by your doctor.  Call your doctor if you have symptoms of infection. Get help right away if your belly swells, your cut bleeds a lot, or you have trouble talking or breathing. This information is not intended to replace advice given to you by your health care provider. Make sure you discuss any questions you have with your health care provider. Document Revised: 09/21/2017 Document Reviewed: 09/21/2017 Elsevier Patient Education  2020 Else[employer] Inc[employer]   Moderate Conscious Sedation, Adult, Care After These instructions provide you with information about caring for yourself after your procedure. Your health care provider may also give you more specific instructions. Your treatment has been planned according to current medical practices, but problems sometimes occur. Call your health care provider if you have any problems or questions after your procedure. What can I expect after the procedure? After your procedure, it is common:  To feel sleepy for several hours.  To feel clumsy and have poor balance for several hours.  To have poor judgment for several hours.  To vomit if you eat too soon. Follow these instructions at home: For at least 24 hours after the procedure:   Do not: ? Participate in activities where you could fall or become injured. ? Drive. ? Use heavy machinery. ? Drink alcohol. ? Take sleeping pills or medicines that cause drowsiness. ? Make important decisions or sign legal documents. ? Take care of children on your own.  Rest. Eating and drinking  Follow the diet recommended by your health care provider.  If you vomit: ? Drink water, juice, or soup when you can drink without vomiting. ? Make sure you have little or no  nausea before eating solid foods. General instructions  Have a responsible adult stay with you until you are awake and alert.  Take over-the-counter and prescription medicines only as told by your health care provider.  If you smoke, do not smoke without supervision.  Keep all follow-up visits as told by your health care provider. This is important. Contact a health care provider if:  You keep feeling nauseous or you keep vomiting.  You feel light-headed.  You develop a rash.  You have a fever. Get help right away if:  You have trouble breathing. This information is not intended to replace advice given to you by your health care provider. Make sure you discuss any questions you have with your health care provider. Document Revised: 08/24/2017 Document Reviewed: 01/01/2016 Elsevier Patient Education  2020 Elsevier Inc.  

## 2020-07-08 NOTE — Procedures (Signed)
Interventional Radiology Procedure Note ° °Procedure: US Guided Biopsy of liver ° °Complications: None ° °Estimated Blood Loss: < 10 mL ° °Findings: °18 G core biopsy of liver performed under US guidance.  Three core samples obtained and sent to Pathology. ° °Shunta Mclaurin T. Keionna Kinnaird, M.D °Pager:  [phone] ° °  °

## 2020-07-09 ENCOUNTER — Other Ambulatory Visit: Payer: Self-pay

## 2020-07-09 LAB — SURGICAL PATHOLOGY

## 2020-07-16 ENCOUNTER — Other Ambulatory Visit: Payer: Self-pay

## 2020-07-16 ENCOUNTER — Inpatient Hospital Stay: Payer: BC Managed Care – PPO | Admitting: Hematology

## 2020-07-16 ENCOUNTER — Other Ambulatory Visit: Payer: Self-pay | Admitting: *Deleted

## 2020-07-16 ENCOUNTER — Inpatient Hospital Stay: Payer: BC Managed Care – PPO | Attending: Hematology

## 2020-07-16 ENCOUNTER — Inpatient Hospital Stay: Payer: BC Managed Care – PPO

## 2020-07-16 VITALS — BP 131/77 | HR 72 | Temp 97.2°F | Resp 18 | Ht 63.5 in | Wt 180.1 lb

## 2020-07-16 DIAGNOSIS — R7989 Other specified abnormal findings of blood chemistry: Secondary | ICD-10-CM

## 2020-07-16 DIAGNOSIS — D582 Other hemoglobinopathies: Secondary | ICD-10-CM | POA: Diagnosis not present

## 2020-07-16 DIAGNOSIS — R945 Abnormal results of liver function studies: Secondary | ICD-10-CM

## 2020-07-16 LAB — CBC WITH DIFFERENTIAL (CANCER CENTER ONLY)
Abs Immature Granulocytes: 0.01 10*3/uL (ref 0.00–0.07)
Basophils Absolute: 0.1 10*3/uL (ref 0.0–0.1)
Basophils Relative: 1 %
Eosinophils Absolute: 0.2 10*3/uL (ref 0.0–0.5)
Eosinophils Relative: 3 %
HCT: 40.1 % (ref 39.0–52.0)
Hemoglobin: 13.1 g/dL (ref 13.0–17.0)
Immature Granulocytes: 0 %
Lymphocytes Relative: 48 %
Lymphs Abs: 4 10*3/uL (ref 0.7–4.0)
MCH: 25.5 pg — ABNORMAL LOW (ref 26.0–34.0)
MCHC: 32.7 g/dL (ref 30.0–36.0)
MCV: 78.2 fL — ABNORMAL LOW (ref 80.0–100.0)
Monocytes Absolute: 0.6 10*3/uL (ref 0.1–1.0)
Monocytes Relative: 7 %
Neutro Abs: 3.5 10*3/uL (ref 1.7–7.7)
Neutrophils Relative %: 41 %
Platelet Count: 225 10*3/uL (ref 150–400)
RBC: 5.13 MIL/uL (ref 4.22–5.81)
RDW: 13 % (ref 11.5–15.5)
WBC Count: 8.4 10*3/uL (ref 4.0–10.5)
nRBC: 0 % (ref 0.0–0.2)

## 2020-07-16 LAB — IRON AND TIBC
Iron: 55 ug/dL (ref 42–163)
Saturation Ratios: 13 % — ABNORMAL LOW (ref 20–55)
TIBC: 415 ug/dL — ABNORMAL HIGH (ref 202–409)
UIBC: 360 ug/dL (ref 117–376)

## 2020-07-16 LAB — CMP (CANCER CENTER ONLY)
ALT: 79 U/L — ABNORMAL HIGH (ref 0–44)
AST: 46 U/L — ABNORMAL HIGH (ref 15–41)
Albumin: 4.1 g/dL (ref 3.5–5.0)
Alkaline Phosphatase: 90 U/L (ref 38–126)
Anion gap: 6 (ref 5–15)
BUN: 19 mg/dL (ref 6–20)
CO2: 27 mmol/L (ref 22–32)
Calcium: 9.7 mg/dL (ref 8.9–10.3)
Chloride: 106 mmol/L (ref 98–111)
Creatinine: 0.9 mg/dL (ref 0.61–1.24)
GFR, Estimated: >60 mL/min (ref >60)
Glucose, Bld: 128 mg/dL — ABNORMAL HIGH (ref 70–99)
Potassium: 4.4 mmol/L (ref 3.5–5.1)
Sodium: 139 mmol/L (ref 135–145)
Total Bilirubin: 0.2 mg/dL — ABNORMAL LOW (ref 0.3–1.2)
Total Protein: 7.3 g/dL (ref 6.5–8.1)

## 2020-07-16 LAB — FERRITIN: Ferritin: 267 ng/mL (ref 24–336)

## 2020-07-16 NOTE — Progress Notes (Signed)
HEMATOLOGY/ONCOLOGY CONSULTATION NOTE  Date of Service: 07/16/2020  Patient Care Team: Patient, No Pcp Per as PCP - [facility]  CHIEF COMPLAINTS/PURPOSE OF CONSULTATION:  Microcytosis and Elevated liver enzymes Elevated ferritin levels  HISTORY OF PRESENTING ILLNESS:   Jared Galvan is a wonderful 49 y.o. male who has been referred to [place] by Jenetta Downer, FNP-C for evaluation and management of elevated liver enzymes and microcytosis. The pt reports that he is doing well overall.   The pt reports that he does not have many chronic medical issues. Pt has HLD and was given Crestor last year, but has since discontinued the medication. He does have seasonal allergies that create a cough. Pt has significant exposure to vaporized chemicals at work and does not use a respirator mask. Pt has no known medication allergies, has never received a blood transfusion and has not donated any blood. He has no family history of blood disorders, but remembers that his mother passed from a "woman's cancer". Pt was eating red meat daily prior to his last lab, but does not cook in a cast iron skillet. Pt is an immigrant from [place] and previously lived in [place]. He as felt the same over the last 6-12 months, is eating well, and denies any new symptoms.   Most recent lab results (03/01/2020) of CBC w/diff is as follows: all values are WNL except for RBC at 5.98, MCH at 25.6, Lymphs Abs at 3.7K.  03/01/2020 Ferritin at 1036 03/01/2020 Iron Panel is as follows: TIBC at 366, UIBC at 260, Iron at 106, Iron Sat at 29. 02/26/2020 CMP is as follows: all values are WNL except for AST at 97, ALT at 209  On review of systems, pt denies low appetite, unexpected weight loss, abdominal pain, joint pain, vision changes, constipation, dysuria and any other symptoms.   On PMHx the pt reports HLD, Allergies. On Social Hx the pt reports he is a non-smoker and does not drink any alcohol. Pt was an  alcohol user in his youth, but quit 20+ years ago.    INTERVAL HISTORY:  Jared Galvan is a wonderful 49 y.o. male who is here for evaluation and management of elevated liver enzymes and microcytosis. We are joined today by his wife and [place] interpreter. The patient's last visit with [place] was on 04/20/2020. The pt reports that he is doing well overall.  The pt reports that he has felt well in the interim. Pt does have children, none of whom have had concerns with anemia in the past.   Of note since the patient's last visit, pt has had MRI Abd ([id number]) completed on 05/10/2020 with results revealing "1. Moderate motion degradation, as detailed above. 2. Probable hepatic steatosis. Given above limitations, no suspicious the liver lesion identified. Given appearance/location on ultrasound, and absence of worrisome finding on the current exam, focal sparing of steatosis is strongly favored. 3. Subtle gallbladder wall irregularity, likely corresponding to small polyps detailed on ultrasound. 4. No biliary duct dilatation of the explanation for elevated liver function tests."  Pt has had Right Liver Surgical Pathology [phone] [id number]) completed on 07/08/2020 with results revealing "- Mildly active steatohepatitis (grade 1 of 3). - Minimal patchy fibrosis (stage 0-1 of 4)".  Lab results today (07/16/20) of CBC w/diff and CMP is as follows: all values are WNL except for MCV at 78.2, MCH at 25.5, Glucose at 128, AST at 46, Alt at 79, Total Bilirubin at 0.2. 07/16/2020 Ferritin at  267 07/16/2020 Iron Panel is as follows: Iron at 55, TIBC at 415, Sat Ratios at 13, UIBC at 360  On review of systems, pt denies abdominal pain, leg swelling and any other symptoms.    MEDICAL HISTORY:  Hyperlipidemia  Overweight   SURGICAL HISTORY: No past surgical history on file.  SOCIAL HISTORY: Social History   Socioeconomic History  . Marital status: Married    Spouse name: Not on file  . Number of  children: 1  . Years of education: 47  . Highest education level: Not on file  Occupational History  . Occupation: chemical  Tobacco Use  . Smoking status: Never Smoker  . Smokeless tobacco: Never Used  Substance and Sexual Activity  . Alcohol use: No  . Drug use: No  . Sexual activity: Yes    Partners: Female  Other Topics Concern  . Not on file  Social History Narrative   From [place].  Came to the [place] in 1992. Lives with his wife and their daughter.  His brother also lives with them.   Social Determinants of Health   Financial Resource Strain:   . Difficulty of Paying Living Expenses: Not on file  Food Insecurity:   . Worried About Charity fundraiser in the Last Year: Not on file  . Ran Out of Food in the Last Year: Not on file  Transportation Needs:   . Lack of Transportation (Medical): Not on file  . Lack of Transportation (Non-Medical): Not on file  Physical Activity:   . Days of Exercise per Week: Not on file  . Minutes of Exercise per Session: Not on file  Stress:   . Feeling of Stress : Not on file  Social Connections:   . Frequency of Communication with Friends and Family: Not on file  . Frequency of Social Gatherings with Friends and Family: Not on file  . Attends Religious Services: Not on file  . Active Member of Clubs or Organizations: Not on file  . Attends Archivist Meetings: Not on file  . Marital Status: Not on file  Intimate Partner Violence:   . Fear of Current or Ex-Partner: Not on file  . Emotionally Abused: Not on file  . Physically Abused: Not on file  . Sexually Abused: Not on file    FAMILY HISTORY: Family History  Problem Relation Age of Onset  . Cancer Mother 73       uterine    ALLERGIES:  has No Known Allergies.  MEDICATIONS:  Current Outpatient Medications  Medication Sig Dispense Refill  . Probiotic Product (PROBIOTIC-10 PO) Take 1 tablet by mouth daily.    . rosuvastatin (CRESTOR) 5 MG tablet Take 5 mg by mouth  daily.     No current facility-administered medications for this visit.    REVIEW OF SYSTEMS:   A 10+ POINT REVIEW OF SYSTEMS WAS OBTAINED including neurology, dermatology, psychiatry, cardiac, respiratory, lymph, extremities, GI, GU, Musculoskeletal, constitutional, breasts, reproductive, HEENT.  All pertinent positives are noted in the HPI.  All others are negative.   PHYSICAL EXAMINATION: ECOG PERFORMANCE STATUS: 0 - Asymptomatic  . There were no vitals filed for this visit. There were no vitals filed for this visit. .There is no height or weight on file to calculate BMI.   [facility]:alert, in no acute distress and comfortable SKIN: no acute rashes, no significant lesions EYES: conjunctiva are pink and non-injected, sclera anicteric OROPHARYNX: MMM, no exudates, no oropharyngeal erythema or ulceration NECK: supple, no JVD  LYMPH:  no palpable lymphadenopathy in the cervical, axillary or inguinal regions LUNGS: clear to auscultation b/l with normal respiratory effort HEART: regular rate & rhythm ABDOMEN:  normoactive bowel sounds , non tender, not distended. No palpable hepatosplenomegaly.  Extremity: no pedal edema PSYCH: alert & oriented x 3 with fluent speech NEURO: no focal motor/sensory deficits  LABORATORY DATA:  I have reviewed the data as listed  . CBC Latest Ref Rng & Units 07/08/2020 06/18/2020 05/21/2020  WBC 4.0 - 10.5 K/uL 8.1 8.5 7.8  Hemoglobin 13.0 - 17.0 g/dL 13.4 13.1 13.0  Hematocrit 39 - 52 % 40.8 40.3 39.9  Platelets 150 - 400 K/uL 250 243 236   . CBC    Component Value Date/Time   WBC 8.1 07/08/2020 1149   RBC 5.10 07/08/2020 1149   HGB 13.4 07/08/2020 1149   HGB 13.1 06/18/2020 0902   HCT 40.8 07/08/2020 1149   PLT 250 07/08/2020 1149   PLT 243 06/18/2020 0902   MCV 80.0 07/08/2020 1149   MCV 76.0 (A) 09/13/2015 1455   MCH 26.3 07/08/2020 1149   MCHC 32.8 07/08/2020 1149   RDW 13.4 07/08/2020 1149   LYMPHSABS 3.7 07/08/2020 1149   MONOABS  0.4 07/08/2020 1149   EOSABS 0.2 07/08/2020 1149   BASOSABS 0.1 07/08/2020 1149    . CMP Latest Ref Rng & Units 06/18/2020 04/23/2020 04/05/2020  Glucose 70 - 99 mg/dL 96 145(H) 109(H)  BUN 6 - 20 mg/dL 22(H) 24(H) 25(H)  Creatinine 0.61 - 1.24 mg/dL 0.84 1.02 1.02  Sodium 135 - 145 mmol/L 139 138 140  Potassium 3.5 - 5.1 mmol/L 4.1 4.0 3.8  Chloride 98 - 111 mmol/L 104 105 106  CO2 22 - 32 mmol/L 25 23 25   Calcium 8.9 - 10.3 mg/dL 9.6 10.6(H) 9.4  Total Protein 6.5 - 8.1 g/dL 7.6 7.6 7.1  Total Bilirubin 0.3 - 1.2 mg/dL 0.3 0.7 1.1  Alkaline Phos 38 - 126 U/L 75 68 65  AST 15 - 41 U/L 55(H) 97(H) 62(H)  ALT 0 - 44 U/L 100(H) 181(H) 113(H)   07/08/2020 Right Liver Surgical Pathology ([id number]):   Hemochromatosis DNA-PCR(c282y,h63d) Order: [id number] Status:  Final result Visible to patient:  Yes (not seen) Next appt:  05/21/2020 at 08:45 AM in Oncology [facility] 1)  0 Result Notes Component 3 wk ago  DNA Mutation Analysis Comment   Comment: (NOTE)  NO MUTATION IDENTIFIED        Component     Latest Ref Rng & Units 04/05/2020 04/23/2020  HGB F     0.0 - 2.0 % 1.8   Hgb A     96.4 - 98.8 % 65.0 (L)   HGB A2     1.8 - 3.2 % Comment   HGB S     0.0 % 0.0   HGB C     0.0 % 0.0   Hgb E     0.0 % 29.8 (H)   HGB VARIANT     0.0 % 0.0   Final Interpretation:      Comment   Iron     42 - 163 ug/dL  78  TIBC     202 - 409 ug/dL  379  Saturation Ratios     20 - 55 %  21  UIBC     117 - 376 ug/dL  300  LDH     98 - 192 U/L 174   Ferritin     24 - 336 ng/mL  1,558 (H)   Hgb Fractionation by HPLC      No reference range information available      Resulting Lab: [facility]      Comments:           (NOTE)           Hemoglobin pattern and concentrations are consistent with            Hgb E trait           (heterozygous). RADIOGRAPHIC STUDIES: I have personally reviewed the radiological images as listed and agreed with the findings  in the report. US BIOPSY (LIVER)  Result Date: 07/08/2020 INDICATION: Abnormal liver function tests and non alcoholic fatty liver disease. The patient has been referred for liver biopsy for further evaluation. EXAM: ULTRASOUND GUIDED CORE BIOPSY OF LIVER MEDICATIONS: None. ANESTHESIA/SEDATION: Fentanyl 100 mcg IV; Versed 2.0 mg IV Moderate Sedation Time:  12 minutes The patient was continuously monitored during the procedure by the interventional radiology nurse under my direct supervision. PROCEDURE: The procedure, risks, benefits, and alternatives were explained to the patient. Questions regarding the procedure were encouraged and answered. The patient understands and consents to the procedure. A time-out was performed prior to initiating the procedure. The right abdominal wall was prepped with chlorhexidine in a sterile fashion, and a sterile drape was applied covering the operative field. A sterile gown and sterile gloves were used for the procedure. Local anesthesia was provided with 1% Lidocaine. After localizing the liver, a 17 gauge trocar needle was advanced into the right lobe. Three separate coaxial 18 gauge core biopsy samples were obtained and submitted in formalin. Gel-Foam pledgets were advanced through the outer needle as it was retracted and removed. COMPLICATIONS: None immediate. FINDINGS: Solid core biopsy samples were obtained. IMPRESSION: Ultrasound-guided core biopsy performed of the liver within right lobe parenchyma. Electronically Signed   By: Aletta Edouard M.D.   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 3) Abnormal Liver function tests [place] Abd - fatty liver with rt hepatic lesion 1.6 cms PLAN: -Discussed pt labwork today, 07/16/20; blood counts look good, liver enzymes are improving, Ferritin down significantly - not quite at goal. -Discussed 05/10/2020 MRI Abd ([id number]) which revealed  "1. Moderate motion degradation, as detailed above. 2. Probable hepatic steatosis. Given above limitations, no suspicious the liver lesion identified. Given appearance/location on ultrasound, and absence of worrisome finding on the current exam, focal sparing of steatosis is strongly favored. 3. Subtle gallbladder wall irregularity, likely corresponding to small polyps detailed on ultrasound. 4. No biliary duct dilatation of the explanation for elevated liver function tests." -Discussed 07/08/2020 Right Liver Surgical Pathology [phone]) which revealed "- Mildly active steatohepatitis (grade 1 of 3). - Minimal patchy fibrosis (stage 0-1 of 4)" -Advised pt that his Hgb E trait will affect the size of his RBC, but would not cause anemia on it's own.  -Recommend pt discuss the Hgb E trait with his children's Pediatrician as this can be inherited genetically.  -Advised pt that he does not have Hemachromatosis. This reduces the need for compulsory phlebotomies - will continue prn. -Advised pt that elevated Ferritin levels are  caused by leaky, inflamed liver. -Advised pt that controlling the inflammation from his Fatty Liver is imperative to control his Ferritin levels.  -Recommend pt avoid alcohol, OTC pain medications, iron supplementation, cast iron skillets, and lower his red meat consumption. -Recommend pt eat a low-fat diet, exercise, and maintain ideal bodyweight. -Advised pt that less liver scarring and damage reduces the risk of liver cancer.  -Recommend regular [place] screening with PCP. -Will proceed with therapeutic phlebotomy today.  -Will see back in 6 months with labs    FOLLOW  UP: Therapeutic phlebotomy today RTC with Dr Irene Limbo with labs in 6 months   The total time spent in the appt was 30 minutes and more than 50% was on counseling and direct patient cares.  All of the patient's questions were answered with apparent satisfaction. The patient knows to call the clinic with any problems, questions or concerns.    Sullivan Lone MD [place] AAHIVMS [facility] Hematology/Oncology Physician [facility]  (Office):       [phone] (Work cell):  [phone] (Fax):           [phone]  07/16/2020 9:06 AM  I, Yevette Edwards, am acting as a scribe for Dr. Sullivan Lone.   .I have reviewed the above documentation for accuracy and completeness, and I agree with the above. Brunetta Genera MD

## 2020-07-16 NOTE — Patient Instructions (Signed)

## 2020-07-16 NOTE — Progress Notes (Signed)
Therapeutic phlebotomy performed per MD order. Procedure began at 1312 with 18G IV catheter. Total of 521cc collected, procedure completed at 1322. IV needle removed and catheter intact. Pt tolerated procedure well and was offered food and beverage. Pt observed for 30 minutes post procedure. Vital signs WDL upon leaving. Ambulatory with no complaints upon departure.

## 2020-07-21 ENCOUNTER — Telehealth: Payer: Self-pay | Admitting: Gastroenterology

## 2020-07-21 NOTE — Telephone Encounter (Signed)
See 07/08/20 liver biopsy result for more information.  Patient wanted to proceed with scheduling procedures at this time. Patient has been scheduled for a pre-visit on 08/03/20 at 9 AM. Patient is scheduled for an endo/colon on 08/25/20 at 10 AM, patient is aware that he will need to arrive at 9 AM. Pt verbalized understanding and had no concerns at the end of the call.

## 2020-08-10 ENCOUNTER — Ambulatory Visit (AMBULATORY_SURGERY_CENTER): Payer: Self-pay | Admitting: *Deleted

## 2020-08-10 ENCOUNTER — Encounter: Payer: Self-pay | Admitting: Gastroenterology

## 2020-08-10 ENCOUNTER — Other Ambulatory Visit: Payer: Self-pay

## 2020-08-10 VITALS — Ht 63.5 in | Wt 180.8 lb

## 2020-08-10 DIAGNOSIS — Z1211 Encounter for screening for malignant neoplasm of colon: Secondary | ICD-10-CM

## 2020-08-10 DIAGNOSIS — R1319 Other dysphagia: Secondary | ICD-10-CM

## 2020-08-10 DIAGNOSIS — R933 Abnormal findings on diagnostic imaging of other parts of digestive tract: Secondary | ICD-10-CM

## 2020-08-10 MED ORDER — SUPREP BOWEL PREP KIT 17.5-3.13-1.6 GM/177ML PO SOLN: 1.0000 | Freq: Once | ORAL | 0 refills | Status: AC

## 2020-08-10 NOTE — Progress Notes (Signed)
Interpreter used today at the [facility] for this pt.  Interpreter's name is- [place].  Wife is also present  Pt states he completed covid vaccines greater than 2 weeks ago  Pt is aware that care partner will wait in the car during procedure; if they feel like they will be too hot or cold to wait in the car; they may wait in the 4 th floor lobby. Patient is aware to bring only one care partner. We want them to wear a mask (we do not have any that we can provide them), practice social distancing, and we will check their temperatures when they get here.  I did remind the patient that their care partner needs to stay in the parking lot the entire time and have a cell phone available, we will call them when the pt is ready for discharge. Patient will wear mask into building.  Pt has never had surgery- denies trouble moving neck   No egg or soy allergy  No home oxygen use   No medications for weight loss taken  Pt denies constipation issues  Suprep code put into RX

## 2020-08-25 ENCOUNTER — Other Ambulatory Visit: Payer: Self-pay

## 2020-08-25 ENCOUNTER — Encounter: Payer: Self-pay | Admitting: Gastroenterology

## 2020-08-25 ENCOUNTER — Ambulatory Visit (AMBULATORY_SURGERY_CENTER): Payer: BC Managed Care – PPO | Admitting: Gastroenterology

## 2020-08-25 VITALS — BP 108/61 | HR 71 | Temp 98.6°F | Resp 15 | Ht 63.0 in | Wt 180.0 lb

## 2020-08-25 DIAGNOSIS — K2100 Gastro-esophageal reflux disease with esophagitis, without bleeding: Secondary | ICD-10-CM

## 2020-08-25 DIAGNOSIS — D120 Benign neoplasm of cecum: Secondary | ICD-10-CM

## 2020-08-25 DIAGNOSIS — R1319 Other dysphagia: Secondary | ICD-10-CM

## 2020-08-25 DIAGNOSIS — D125 Benign neoplasm of sigmoid colon: Secondary | ICD-10-CM

## 2020-08-25 DIAGNOSIS — Z1211 Encounter for screening for malignant neoplasm of colon: Secondary | ICD-10-CM

## 2020-08-25 DIAGNOSIS — D128 Benign neoplasm of rectum: Secondary | ICD-10-CM

## 2020-08-25 DIAGNOSIS — D127 Benign neoplasm of rectosigmoid junction: Secondary | ICD-10-CM | POA: Diagnosis not present

## 2020-08-25 DIAGNOSIS — D124 Benign neoplasm of descending colon: Secondary | ICD-10-CM | POA: Diagnosis not present

## 2020-08-25 DIAGNOSIS — K449 Diaphragmatic hernia without obstruction or gangrene: Secondary | ICD-10-CM

## 2020-08-25 MED ORDER — OMEPRAZOLE 20 MG PO CPDR: 20.0000 mg | DELAYED_RELEASE_CAPSULE | Freq: Every day | ORAL | 0 refills | Status: DC

## 2020-08-25 MED ORDER — SODIUM CHLORIDE 0.9 % IV SOLN: 500.0000 mL | INTRAVENOUS | Status: DC

## 2020-08-25 NOTE — Op Note (Signed)
[facility] Patient Name: Jared [facility] Procedure Date: 08/25/2020 10:12 AM MRN: [id number] Endoscopist: Mallie Mussel L. Jared [facility] , MD Age: 49 Referring MD:  Date of Birth: 1971-07-13 Gender: Male Account #: [id number] Procedure:                Upper GI endoscopy Indications:              Esophageal dysphagia, Abdominal bloating Medicines:                Monitored Anesthesia Care Procedure:                Pre-Anesthesia Assessment:                           - Prior to the procedure, a History and Physical                            was performed, and patient medications and                            allergies were reviewed. The patient's tolerance of                            previous anesthesia was also reviewed. The risks                            and benefits of the procedure and the sedation                            options and risks were discussed with the patient.                            All questions were answered, and informed consent                            was obtained. Prior Anticoagulants: The patient has                            taken no previous anticoagulant or antiplatelet                            agents. ASA Grade Assessment: I - A normal, healthy                            patient. After reviewing the risks and benefits,                            the patient was deemed in satisfactory condition to                            undergo the procedure.                           After obtaining informed consent, the endoscope was  passed under direct vision. Throughout the                            procedure, the patient's blood pressure, pulse, and                            oxygen saturations were monitored continuously. The                            Endoscope was introduced through the mouth, and                            advanced to the second part of duodenum. The upper                            GI endoscopy was accomplished  without difficulty.                            The patient tolerated the procedure poorly due to                            secretions and desaturation (see below). Scope In: Scope Out: Findings:                 LA Grade A (one or more mucosal breaks less than 5                            mm, not extending between tops of 2 mucosal folds)                            esophagitis was found at the gastroesophageal                            junction.                           A 1-2 cm sliding hiatal hernia was present.                           There is no endoscopic evidence of stricture in the                            entire esophagus. (no photo obtained - scope                            removed during respiratory distress)                           The stomach was normal.                           The cardia and gastric fundus were normal on                            retroflexion.  The examined duodenum was normal. Complications:            Hypoxia (near end of procedure patient had                            secretions-> coughing-> brief oxygen desaturation                            to 65-70% requiring jaw thrust, suctioning and BVM                            oxygen/airway support until patient awoke enough to                            cough and maintain airway) Estimated Blood Loss:     Estimated blood loss: none. Impression:               - LA Grade A reflux esophagitis.                           - A 1-2 cm hiatal hernia.                           - Normal stomach.                           - Normal examined duodenum.                           - No specimens collected. Recommendation:           - Patient has a contact number available for                            emergencies. The signs and symptoms of potential                            delayed complications were discussed with the                            patient. Return to normal activities  tomorrow.                            Written discharge instructions were provided to the                            patient.                           - Resume previous diet.                           - Continue present medications.                           - Use Prilosec (omeprazole) 20 mg PO daily for 4  weeks.                           - Follow an antireflux regimen indefinitely. Low                            carbohydrate diet, exercise and weight loss would                            improve GERD and recently-diagnosed                            metabolic-associated steatohepatitis. Kimley Apsey L. Jared Carrow, MD 08/25/2020 11:13:16 AM This report has been signed electronically.

## 2020-08-25 NOTE — Patient Instructions (Signed)
Handouts given for polyps, hemorrhoids and Hiatal Hernia.  Pick up new prescription for Omeprazole 20 mg and take it daily for 4 weeks.  Follow a low carb diet, exercise and losing weight would help improve your reflux.  Await pathology results.  HM NAY QU V? ? TH?C HI?N TH? THU?T N?I SOI T?I TRUNG TM N?I SOI [place]: Vui lng xem bo co th? thu?t ? ???c g?i cho qu v?, n?u qu v? c b?t k? th?c m?c g trong su?t qu trnh th?m khm. N?u bo co th? thu?t khng th? gi?i ?p th?c m?c c?a qu v?, vui lng g?i cho bc s? chuyn khoa tiu ha c?a qu v? ?? ???c gi?i ?p. N?u qu v? ? yu c?u khng cung c?p thng tin chi ti?t v? k?t qu? th? thu?t cho ??i tc ch?m [place] c?a qu v?, th bo co th? thu?t s? ???c g?i trong m?t phong b ???c dn kn ?? qu v? xem khi thu?n ti?n.   QU V? C TH?: C?m gic ch??ng b?ng. Trung ti?n nhi?u h?n bnh th??ng. ?i b? c th? gip ??y ra ngoi khng kh ?i vo ???ng tiu ha trong khi th?c hi?n th? thu?t v gi?m ch??ng b?ng. N?u qu v? ti?n hnh n?i soi d??i (nh? n?i soi ??i trng Krist?c soi k?t trng xch-ma b?ng ?ng m?m), qu v? c th? th?y cc ch?m mu ? phn [place]?c trn gi?y v? sinh. N?u qu v? ? lm s?ch ??i trng ?? th?c hi?n th? thu?t, qu v? c th? khng ?i ??i ti?n nh? bnh th??ng trong vi ngy.  Vui Lng L?u : Qu v? c th? b? kch ?ng v ngh?t m?i [place]?c ch?y n??c m?i. Tnh tr?ng ny l do ?nh h??ng c?a vi?c th? bnh oxy trong qu trnh th?c hi?n th? thu?t. Qu v? khng c?n lo l?ng, tnh tr?ng ny s? bi?n m?t sau m?t [place]?c vi ngy.   CC TRI?U CH?NG C?N BO CO NGAY  Sau khi th?c hi?n n?i soi d??i (n?i soi ??i trng Attig?c soi k?t trng xch-ma b?ng ?ng m?m): Phn c nhi?u mu ?au b?ng d? d?i Deyoe?c ngy cng t?ng Xu?t hi?n v?t s?ng b?ng m?i, c?p tnh S?t t? 100F tr? ln   Sau khi th?c hi?n n?i soi trn (EGD)  Nn ra mu Meece?c ch?t mu c ph s?m Xu?t hi?n c?n ?au ng?c [place]?c ?au d??i x??ng b? vai m?i Nu?t ?au Raetz?c kh nu?t M?i b? kh th? S?t t? 100F tr? ln Phn  ?en nh? m?c  ??i v?i cc v?n ?? kh?n c?p [place]?c c?p c?u, qu v? c th? lin h? bc s? chuyn khoa tiu ha b?t k? lc no b?ng cch g?i ??n s? [phone].   CH? ?? ?N U?NG: Chng ti [employer] v? tr??c tin nn ?n nh?, nh?ng sau ? qu v? c th? ?n theo ch? ?? bnh th??ng. U?ng nhi?u n??c nh?ng ph?i trnh ?? u?ng c c?n trong 24 gi?.  [place]owski?T ??NG: Qu v? c?n ln k? Ivery?ch ?? ngh? ng?i trong ngy hm nay v KHNG NN LI XE Masaki?c s? d?ng my mc n?ng cho ??n ngy mai (do tc d?ng c?a thu?c an th?n s? d?ng trong th? thu?t).   THEO DI: Nhn vin c?a chng ti s? g?i cho qu v? theo s? ?i?n tho?i trong b?nh n vo ngy lm vi?c ti?p theo sau ngy th?c hi?n th? thu?t ?? ki?m tra tnh tr?ng c?a qu v? v gi?i ?p cc cu h?i [place]?c th?c  m?c c?a qu v? v? thng tin m qu v? ???c cung c?p sau khi th?c hi?n th? thu?t. N?u chng ti khng lin l?c ???c v?i qu v?, chng ti s? ?? l?i tin nh?n. Tuy nhin, n?u qu v? c?m th?y kh?e v khng g?p b?t k? s? c? no, qu v? khng c?n g?i l?i cho chng ti. Chng ti s? gi? ??nh r?ng qu v? ? tr? l?i sinh [place]?t bnh th??ng v khng g?p b?t k? s? c? no.  N?u qu v? ???c l?y sinh thi?t, chng ti s? lin l?c v?i qu v? qua ?i?n tho?i Genao?c th? trong 1-3 tu?n ti?p theo. Vui lng g?i cho chng ti theo s? [phone] n?u qu v? khng nh?n ???c thng tin v? k?t qu? sinh thi?t trong 3 tu?n.  CH? K/B?O M?TSander Nephew v? v/Purdy?c ??i tc ch?m [place] c?a qu v? ? k vo cc ti li?u s? ???c nh?p vo h? s? y t? ?i?n t? c?a qu v?. Ch? k ny xc nh?n r?ng cc thng tin trn ?y trong B?n Tm T?t Sau Khi Th?m Khm c?a qu v? ? ???c xem xt v hi?u r. Qu v? v/[place]?c

## 2020-08-25 NOTE — Progress Notes (Signed)
1050 Desat to 70's during upper endoscopy, suctioned for copious secretions and ambu bagged, O2 Sats to 90's, SR's 1058 O2 sats .96, SR's, a/o x 3

## 2020-08-25 NOTE — Op Note (Signed)
[facility] Patient Name: Jared Galvan Procedure Date: 08/25/2020 10:12 AM MRN: [id number] Endoscopist: Mallie Mussel L. [name] , MD Age: 49 Referring MD:  Date of Birth: 1970/11/06 Gender: Male Account #: [id number] Procedure:                Colonoscopy Indications:              Screening for colorectal malignant neoplasm, This                            is the patient's first colonoscopy Medicines:                Monitored Anesthesia Care Procedure:                Pre-Anesthesia Assessment:                           - Prior to the procedure, a History and Physical                            was performed, and patient medications and                            allergies were reviewed. The patient's tolerance of                            previous anesthesia was also reviewed. The risks                            and benefits of the procedure and the sedation                            options and risks were discussed with the patient.                            All questions were answered, and informed consent                            was obtained. Prior Anticoagulants: The patient has                            taken no previous anticoagulant or antiplatelet                            agents. ASA Grade Assessment: I - A normal, healthy                            patient. After reviewing the risks and benefits,                            the patient was deemed in satisfactory condition to                            undergo the procedure.  After obtaining informed consent, the colonoscope                            was passed under direct vision. Throughout the                            procedure, the patient's blood pressure, pulse, and                            oxygen saturations were monitored continuously. The                            Colonoscope was introduced through the anus and                            advanced to the the terminal ileum, with                             identification of the appendiceal orifice and IC                            valve. The colonoscopy was performed without                            difficulty. The patient tolerated the procedure                            well. The quality of the bowel preparation was                            excellent. The terminal ileum, ileocecal valve,                            appendiceal orifice, and rectum were photographed.                            The bowel preparation used was Plenvu. Scope In: 10:25:34 AM Scope Out: 10:41:43 AM Scope Withdrawal Time: 0 hours 14 minutes 12 seconds  Total Procedure Duration: 0 hours 16 minutes 9 seconds  Findings:                 The perianal and digital rectal examinations were                            normal.                           Four sessile polyps were found in the rectum,                            sigmoid colon, descending colon and cecum. The                            polyps were 3 to 5 mm in size. These polyps were  removed with a cold snare. Resection and retrieval                            were complete.                           Internal hemorrhoids were found.                           The exam was otherwise without abnormality on                            direct and retroflexion views. Complications:            No immediate complications. Estimated Blood Loss:     Estimated blood loss was minimal. Impression:               - Four 3 to 5 mm polyps in the rectum, in the                            sigmoid colon, in the descending colon and in the                            cecum, removed with a cold snare. Resected and                            retrieved.                           - Internal hemorrhoids.                           - The examination was otherwise normal on direct                            and retroflexion views. Recommendation:           - Patient has a contact number  available for                            emergencies. The signs and symptoms of potential                            delayed complications were discussed with the                            patient. Return to normal activities tomorrow.                            Written discharge instructions were provided to the                            patient.                           - Resume previous diet.                           -  Continue present medications.                           - Await pathology results.                           - Repeat colonoscopy is recommended for                            surveillance. The colonoscopy date will be                            determined after pathology results from today's                            exam become available for review.                           - See the other procedure note for documentation of                            additional recommendations. Laycie Schriner L. [name], MD 08/25/2020 11:02:06 AM This report has been signed electronically.

## 2020-08-25 NOTE — Progress Notes (Signed)
A/ox3, pleased with MAC, report to RN 

## 2020-08-25 NOTE — Progress Notes (Signed)
Called to room to assist during endoscopic procedure.  Patient ID and intended procedure confirmed with present staff. Received instructions for my participation in the procedure from the performing physician.  

## 2020-08-27 ENCOUNTER — Telehealth: Payer: Self-pay | Admitting: *Deleted

## 2020-08-27 NOTE — Telephone Encounter (Signed)
  Follow up Call-  Call back number 08/25/2020  Post procedure Call Back phone  # [phone]  Permission to leave phone message Yes  Some recent data might be hidden     Patient questions:  Do you have a fever, pain , or abdominal swelling? No. Pain Score  0 *  Have you tolerated food without any problems? Yes.    Have you been able to return to your normal activities? Yes.    Do you have any questions about your discharge instructions: Diet   No. Medications  No. Follow up visit  No.  Do you have questions or concerns about your Care? No.  Actions: * If pain score is 4 or above: 1. No action needed, pain <4.Have you developed a fever since your procedure? no  2.   Have you had an respiratory symptoms (SOB or cough) since your procedure? no  3.   Have you tested positive for COVID 19 since your procedure no  4.   Have you had any family members/close contacts diagnosed with the COVID 19 since your procedure?  no   If yes to any of these questions please route to Joylene John, RN and Joella Prince, RN

## 2020-08-31 ENCOUNTER — Encounter: Payer: Self-pay | Admitting: Gastroenterology

## 2021-01-05 ENCOUNTER — Telehealth: Payer: Self-pay | Admitting: Hematology

## 2021-01-05 NOTE — Telephone Encounter (Signed)
Left message with rescheduled upcoming appointment due to provider's PAL. Gave option to call back to reschedule if needed. 

## 2021-01-14 ENCOUNTER — Ambulatory Visit: Payer: BC Managed Care – PPO | Admitting: Hematology

## 2021-01-14 ENCOUNTER — Other Ambulatory Visit: Payer: BC Managed Care – PPO

## 2021-02-03 NOTE — Progress Notes (Signed)
HEMATOLOGY/ONCOLOGY CONSULTATION NOTE  Date of Service: 02/04/2021  Patient Care Team: Patient, No Pcp Per (Inactive) as PCP - General (General Practice)  CHIEF COMPLAINTS/PURPOSE OF CONSULTATION:  Microcytosis and Elevated liver enzymes Elevated ferritin levels  HISTORY OF PRESENTING ILLNESS:   Jared Galvan is a wonderful 50 y.o. male who has been referred to [place] by Jenetta Downer, FNP-C for evaluation and management of elevated liver enzymes and microcytosis. The pt reports that he is doing well overall.   The pt reports that he does not have many chronic medical issues. Pt has HLD and was given Crestor last year, but has since discontinued the medication. He does have seasonal allergies that create a cough. Pt has significant exposure to vaporized chemicals at work and does not use a respirator mask. Pt has no known medication allergies, has never received a blood transfusion and has not donated any blood. He has no family history of blood disorders, but remembers that his mother passed from a "woman's cancer". Pt was eating red meat daily prior to his last lab, but does not cook in a cast iron skillet. Pt is an immigrant from [place] and previously lived in [place]. He as felt the same over the last 6-12 months, is eating well, and denies any new symptoms.   Most recent lab results (03/01/2020) of CBC w/diff is as follows: all values are WNL except for RBC at 5.98, MCH at 25.6, Lymphs Abs at 3.7K.  03/01/2020 Ferritin at 1036 03/01/2020 Iron Panel is as follows: TIBC at 366, UIBC at 260, Iron at 106, Iron Sat at 29. 02/26/2020 CMP is as follows: all values are WNL except for AST at 97, ALT at 209  On review of systems, pt denies low appetite, unexpected weight loss, abdominal pain, joint pain, vision changes, constipation, dysuria and any other symptoms.   On PMHx the pt reports HLD, Allergies. On Social Hx the pt reports he is a non-smoker and does not drink any alcohol.  Pt was an alcohol user in his youth, but quit 20+ years ago.    INTERVAL HISTORY:  Jared Galvan is a wonderful 50 y.o. male who is here for evaluation and management of elevated liver enzymes and microcytosis. We are joined today by his wife and [place] interpreter. The patient's last visit with [place] was on 07/16/2020. The pt reports that he is doing well overall.  The pt reports that he has been feeling well since the last visit. The pt had an Endoscopy and Colonoscopy with Dr. [name] in December. The pt notes that to his understanding they said his liver looked normal and noted no concerns. The pt has not had a visit since then. The pt has been doing exercise daily.   Lab results today 02/04/2021 of CBC w/diff and CMP is as follows: all values are WNL except for MCV of 77.5, MCH of 22.5, AST of 53, ALT of 95. 02/04/2021 Iron of 91, Sat Ratio of 20. 02/04/2021 Ferritin 225  On review of systems, pt denies abdominal pain, leg swelling, and any other symptoms.   MEDICAL HISTORY:  Hyperlipidemia  Overweight   SURGICAL HISTORY: No past surgical history on file.  SOCIAL HISTORY: Social History   Socioeconomic History  . Marital status: Married    Spouse name: Not on file  . Number of children: 1  . Years of education: 9  . Highest education level: Not on file  Occupational History  . Occupation: Banker  Tobacco Use  . Smoking status: Never Smoker  . Smokeless tobacco: Never Used  Vaping Use  . Vaping Use: Never used  Substance and Sexual Activity  . Alcohol use: No  . Drug use: No  . Sexual activity: Yes    Partners: Female  Other Topics Concern  . Not on file  Social History Narrative   From [place].  Came to the [place] in 1992. Lives with his wife and their daughter.  His brother also lives with them.   Social Determinants of Health   Financial Resource Strain: Not on file  Food Insecurity: Not on file  Transportation Needs: Not on file  Physical Activity: Not on file   Stress: Not on file  Social Connections: Not on file  Intimate Partner Violence: Not on file    FAMILY HISTORY: Family History  Problem Relation Age of Onset  . Cancer Mother 23       uterine  . Colon cancer Neg Hx   . Esophageal cancer Neg Hx   . Rectal cancer Neg Hx   . Stomach cancer Neg Hx     ALLERGIES:  has No Known Allergies.  MEDICATIONS:  Current Outpatient Medications  Medication Sig Dispense Refill  . omeprazole (PRILOSEC) 20 MG capsule Take 1 capsule (20 mg total) by mouth daily. 28 capsule 0  . Probiotic Product (PROBIOTIC-10 PO) Take 1 tablet by mouth daily.    . rosuvastatin (CRESTOR) 5 MG tablet Take 5 mg by mouth daily.     No current facility-administered medications for this visit.    REVIEW OF SYSTEMS:   10 Point review of Systems was done is negative except as noted above.  PHYSICAL EXAMINATION: ECOG PERFORMANCE STATUS: 0 - Asymptomatic  . Vitals:   02/04/21 1200  BP: 121/76  Pulse: 75  Resp: 20  Temp: (!) 97.4 F (36.3 C)  SpO2: 99%   Filed Weights   02/04/21 1200  Weight: 181 lb 6.4 oz (82.3 kg)   .Body mass index is 32.13 kg/m.    GENERAL:alert, in no acute distress and comfortable SKIN: no acute rashes, no significant lesions EYES: conjunctiva are pink and non-injected, sclera anicteric OROPHARYNX: MMM, no exudates, no oropharyngeal erythema or ulceration NECK: supple, no JVD LYMPH:  no palpable lymphadenopathy in the cervical, axillary or inguinal regions LUNGS: clear to auscultation b/l with normal respiratory effort HEART: regular rate & rhythm ABDOMEN:  normoactive bowel sounds , non tender, not distended. No palpable hepatosplenomegaly.  Extremity: no pedal edema PSYCH: alert & oriented x 3 with fluent speech NEURO: no focal motor/sensory deficits  LABORATORY DATA:  I have reviewed the data as listed  . CBC Latest Ref Rng & Units 02/04/2021 07/16/2020 07/08/2020  WBC 4.0 - 10.5 K/uL 8.7 8.4 8.1  Hemoglobin 13.0 -  17.0 g/dL 14.3 13.1 13.4  Hematocrit 39.0 - 52.0 % 43.5 40.1 40.8  Platelets 150 - 400 K/uL 243 225 250   . CBC    Component Value Date/Time   WBC 8.7 02/04/2021 1107   RBC 5.61 02/04/2021 1107   HGB 14.3 02/04/2021 1107   HGB 13.1 07/16/2020 1145   HCT 43.5 02/04/2021 1107   PLT 243 02/04/2021 1107   PLT 225 07/16/2020 1145   MCV 77.5 (L) 02/04/2021 1107   MCV 76.0 (A) 09/13/2015 1455   MCH 25.5 (L) 02/04/2021 1107   MCHC 32.9 02/04/2021 1107   RDW 14.0 02/04/2021 1107   LYMPHSABS 3.6 02/04/2021 1107   MONOABS 0.5 02/04/2021 1107  EOSABS 0.2 02/04/2021 1107   BASOSABS 0.0 02/04/2021 1107    . CMP Latest Ref Rng & Units 02/04/2021 07/16/2020 06/18/2020  Glucose 70 - 99 mg/dL 99 128(H) 96  BUN 6 - 20 mg/dL 17 19 22(H)  Creatinine 0.61 - 1.24 mg/dL 0.96 0.90 0.84  Sodium 135 - 145 mmol/L 139 139 139  Potassium 3.5 - 5.1 mmol/L 4.2 4.4 4.1  Chloride 98 - 111 mmol/L 104 106 104  CO2 22 - 32 mmol/L 29 27 25   Calcium 8.9 - 10.3 mg/dL 9.9 9.7 9.6  Total Protein 6.5 - 8.1 g/dL 7.7 7.3 7.6  Total Bilirubin 0.3 - 1.2 mg/dL 0.6 0.2(L) 0.3  Alkaline Phos 38 - 126 U/L 70 90 75  AST 15 - 41 U/L 53(H) 46(H) 55(H)  ALT 0 - 44 U/L 95(H) 79(H) 100(H)   07/08/2020 Right Liver Surgical Pathology ([id number]):   Hemochromatosis DNA-PCR(c282y,h63d) Order: [id number] Status:  Final result Visible to patient:  Yes (not seen) Next appt:  05/21/2020 at 08:45 AM in Oncology [facility] 1)  0 Result Notes Component 3 wk ago  DNA Mutation Analysis Comment   Comment: (NOTE)  NO MUTATION IDENTIFIED        Component     Latest Ref Rng & Units 04/05/2020 04/23/2020  HGB F     0.0 - 2.0 % 1.8   Hgb A     96.4 - 98.8 % 65.0 (L)   HGB A2     1.8 - 3.2 % Comment   HGB S     0.0 % 0.0   HGB C     0.0 % 0.0   Hgb E     0.0 % 29.8 (H)   HGB VARIANT     0.0 % 0.0   Final Interpretation:      Comment   Iron     42 - 163 ug/dL  78  TIBC     202 - 409 ug/dL  379  Saturation  Ratios     20 - 55 %  21  UIBC     117 - 376 ug/dL  300  LDH     98 - 192 U/L 174   Ferritin     24 - 336 ng/mL  1,558 (H)   Hgb Fractionation by HPLC      No reference range information available      Resulting Lab: [facility]      Comments:           (NOTE)           Hemoglobin pattern and concentrations are consistent with            Hgb E trait           (heterozygous). RADIOGRAPHIC STUDIES: I have personally reviewed the radiological images as listed and agreed with the findings in the report. No results found.  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 3) Abnormal Liver function tests [place] Abd - fatty liver with rt hepatic lesion 1.6 cms. 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   PLAN: -Discussed pt labwork today, 02/04/2021; blood counts normal, chemistries normal except elevation in liver enzymes still. Iron Sat not elevated. Ferritin 225. -Advised pt that  we do not need to set up for phlebotomies here, but if iron levels are high he can choose to donate some blood 2-3x each year. -Advised pt the scarring in the liver from NASH can eventually lead to liver cirrhosis if not managed. -Recommend pt avoid alcohol, OTC pain medications, iron supplementation, cast iron skillets, and lower his red meat consumption.  -Recommend pt eat a low-fat diet, exercise, and maintain ideal bodyweight. -Recommended more fresh fruits, vegetables, and fish and less red meats. -Recommend regular HCC screening with PCP. -Continue f/u w PCP and GI to make proper lifestyle changes and manage elevation in liver enzymes from NASH -Recommended pt get a PCP to watch for risk  factors. -Will see back as needed.   FOLLOW UP: RTC w Dr Irene Limbo as needed   The total time spent in the appt was 20 minutes and more than 50% was on counseling and direct patient cares.  All of the patient's questions were answered with apparent satisfaction. The patient knows to call the clinic with any problems, questions or concerns.    Sullivan Lone MD [place] AAHIVMS [facility] Hematology/Oncology Physician [facility]  (Office):       [phone] (Work cell):  [phone] (Fax):           [phone]  02/04/2021 12:46 PM  I, Reinaldo Raddle, am acting as scribe for Dr. Sullivan Lone, MD .I have reviewed the above documentation for accuracy and completeness, and I agree with the above. Brunetta Genera MD

## 2021-02-04 ENCOUNTER — Other Ambulatory Visit: Payer: Self-pay

## 2021-02-04 ENCOUNTER — Inpatient Hospital Stay: Payer: BC Managed Care – PPO | Admitting: Hematology

## 2021-02-04 ENCOUNTER — Inpatient Hospital Stay: Payer: BC Managed Care – PPO | Attending: Hematology

## 2021-02-04 VITALS — BP 121/76 | HR 75 | Temp 97.4°F | Resp 20 | Wt 181.4 lb

## 2021-02-04 DIAGNOSIS — D582 Other hemoglobinopathies: Secondary | ICD-10-CM

## 2021-02-04 DIAGNOSIS — R7989 Other specified abnormal findings of blood chemistry: Secondary | ICD-10-CM | POA: Diagnosis not present

## 2021-02-04 DIAGNOSIS — K7581 Nonalcoholic steatohepatitis (NASH): Secondary | ICD-10-CM | POA: Diagnosis not present

## 2021-02-04 LAB — CMP (CANCER CENTER ONLY)
ALT: 95 U/L — ABNORMAL HIGH (ref 0–44)
AST: 53 U/L — ABNORMAL HIGH (ref 15–41)
Albumin: 4.4 g/dL (ref 3.5–5.0)
Alkaline Phosphatase: 70 U/L (ref 38–126)
Anion gap: 6 (ref 5–15)
BUN: 17 mg/dL (ref 6–20)
CO2: 29 mmol/L (ref 22–32)
Calcium: 9.9 mg/dL (ref 8.9–10.3)
Chloride: 104 mmol/L (ref 98–111)
Creatinine: 0.96 mg/dL (ref 0.61–1.24)
GFR, Estimated: >60 mL/min (ref >60)
Glucose, Bld: 99 mg/dL (ref 70–99)
Potassium: 4.2 mmol/L (ref 3.5–5.1)
Sodium: 139 mmol/L (ref 135–145)
Total Bilirubin: 0.6 mg/dL (ref 0.3–1.2)
Total Protein: 7.7 g/dL (ref 6.5–8.1)

## 2021-02-04 LAB — CBC WITH DIFFERENTIAL/PLATELET
Abs Immature Granulocytes: 0.01 10*3/uL (ref 0.00–0.07)
Basophils Absolute: 0 10*3/uL (ref 0.0–0.1)
Basophils Relative: 1 %
Eosinophils Absolute: 0.2 10*3/uL (ref 0.0–0.5)
Eosinophils Relative: 2 %
HCT: 43.5 % (ref 39.0–52.0)
Hemoglobin: 14.3 g/dL (ref 13.0–17.0)
Immature Granulocytes: 0 %
Lymphocytes Relative: 42 %
Lymphs Abs: 3.6 10*3/uL (ref 0.7–4.0)
MCH: 25.5 pg — ABNORMAL LOW (ref 26.0–34.0)
MCHC: 32.9 g/dL (ref 30.0–36.0)
MCV: 77.5 fL — ABNORMAL LOW (ref 80.0–100.0)
Monocytes Absolute: 0.5 10*3/uL (ref 0.1–1.0)
Monocytes Relative: 6 %
Neutro Abs: 4.3 10*3/uL (ref 1.7–7.7)
Neutrophils Relative %: 49 %
Platelets: 243 10*3/uL (ref 150–400)
RBC: 5.61 MIL/uL (ref 4.22–5.81)
RDW: 14 % (ref 11.5–15.5)
WBC: 8.7 10*3/uL (ref 4.0–10.5)
nRBC: 0 % (ref 0.0–0.2)

## 2021-02-04 LAB — IRON AND TIBC
Iron: 91 ug/dL (ref 42–163)
Saturation Ratios: 20 % (ref 20–55)
TIBC: 456 ug/dL — ABNORMAL HIGH (ref 202–409)
UIBC: 365 ug/dL (ref 117–376)

## 2021-02-04 LAB — FERRITIN: Ferritin: 225 ng/mL (ref 24–336)

## 2021-06-28 ENCOUNTER — Encounter: Payer: Self-pay | Admitting: Hematology

## 2021-08-23 IMAGING — MR MR ABDOMEN WO/W CM
19 series · 48 of 48 positions shown · IV contrast (gadavist)
Comparison: 04/20/2027 abdominal ultrasound

CLINICAL DATA: Elevated liver function test. 1.6 cm right hepatic
lesion on ultrasound.

EXAM:
MRI ABDOMEN WITHOUT AND WITH CONTRAST
TECHNIQUE: Multiplanar multisequence MR imaging of the abdomen was performed
both before and after the administration of intravenous contrast.
CONTRAST:  8mL GADAVIST GADOBUTROL 1 MMOL/ML IV SOLN

[Series 3: T2 · coronal · 6.0mm · 1.56mm/px · 2 of 35 slices shown (1 of 2)]
[im 1/35]
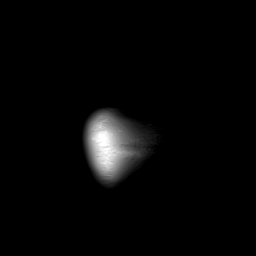
[im 35/35]
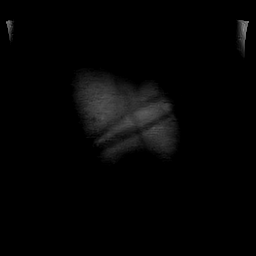

[Series 5: T2 fat-sat · axial · 6.0mm · 1.25mm/px · z∈[-98,+154]mm · 2 of 36 slices shown]
[im 1/36]
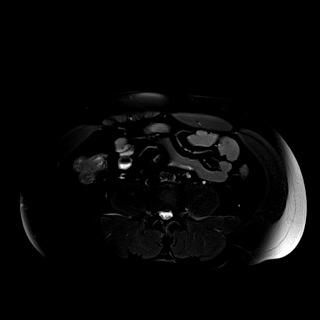
[im 36/36]
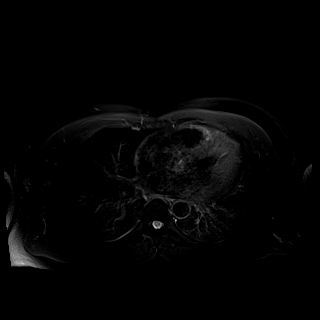

[Series 6: T1 · axial · 3.0mm · 1.19mm/px · z∈[-98,+163]mm · 3 of 88 slices shown (1 of 2)]
[im 1/88]
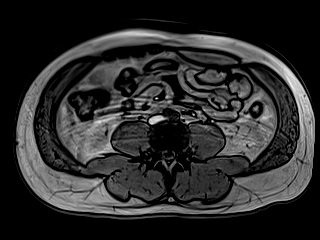
[im 44/88]
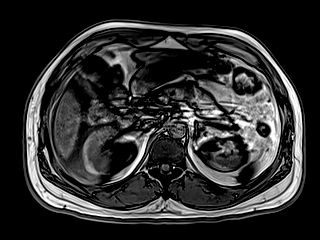
[im 88/88]
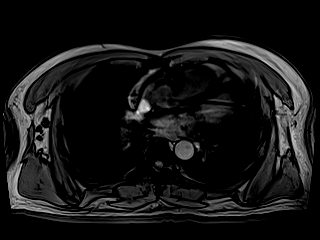

[Series 7: T1 · axial · 3.0mm · 1.19mm/px · z∈[-98,+163]mm · 3 of 88 slices shown (2 of 2)]
[im 1/88]
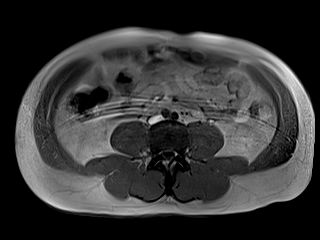
[im 44/88]
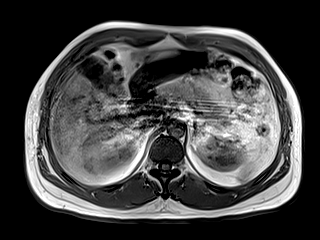
[im 88/88]
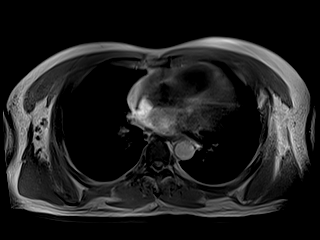

[Series 8: DWI · axial · 6.0mm · 1.38mm/px · z∈[-119,+162]mm · 3 of 80 slices shown (1 of 2)]
[im 1/80]
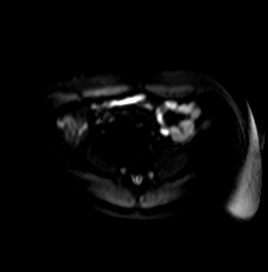
[im 40/80]
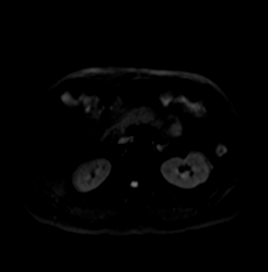
[im 80/80]
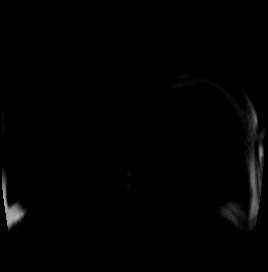

[Series 9: DWI · axial · 6.0mm · 1.38mm/px · 1 of 40 slices shown (2 of 2)]
[im 1/40]
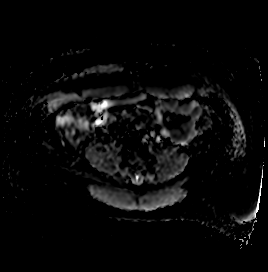

[Series 10: bSSFP · axial · 4.0mm · 0.72mm/px · z∈[-102,+162]mm · 2 of 67 slices shown]
[im 1/67]
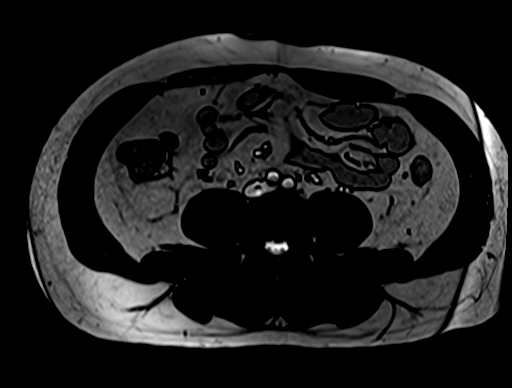
[im 67/67]
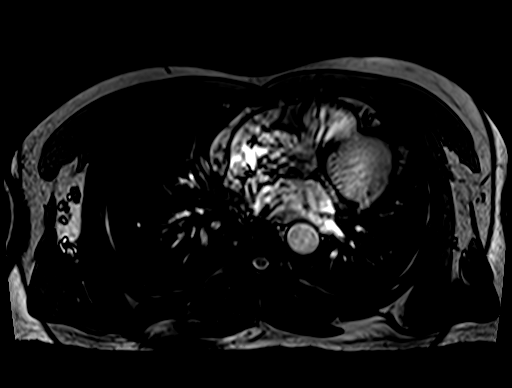

[Series 15: T1 dynamic · axial · 3.0mm · 1.19mm/px · z∈[-120,+165]mm · 3 of 96 slices shown (1 of 7)]
[im 1/96]
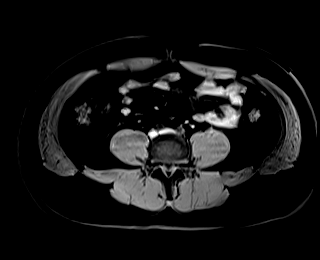
[im 48/96]
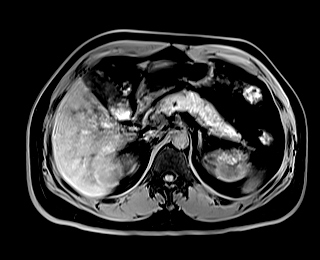
[im 96/96]
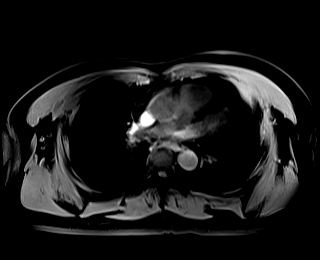

[Series 18: T1 dynamic · axial · 3.0mm · 1.19mm/px · z∈[-120,+165]mm · 3 of 96 slices shown (2 of 7)]
[im 1/96]
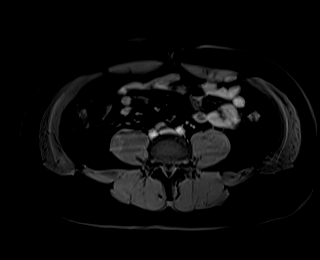
[im 48/96]
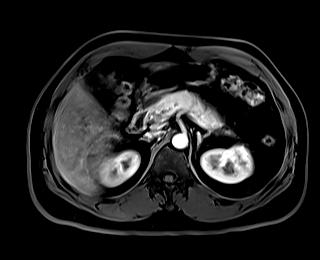
[im 96/96]
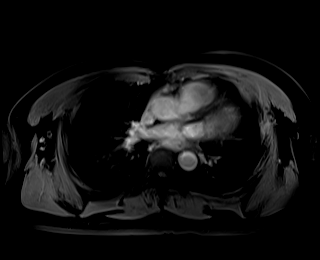

[Series 20: T1 dynamic · axial · 3.0mm · 1.19mm/px · z∈[-120,+165]mm · 3 of 96 slices shown (3 of 7)]
[im 1/96]
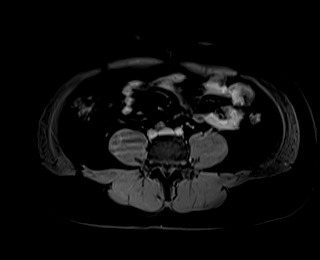
[im 48/96]
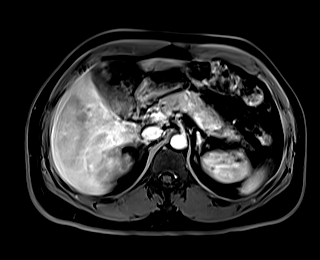
[im 96/96]
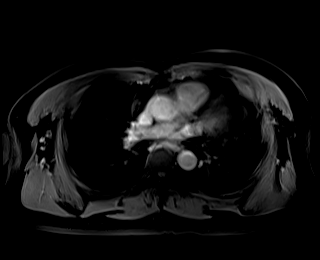

[Series 22: T1 dynamic · axial · 3.0mm · 1.19mm/px · z∈[-120,+165]mm · 3 of 96 slices shown (4 of 7)]
[im 1/96]
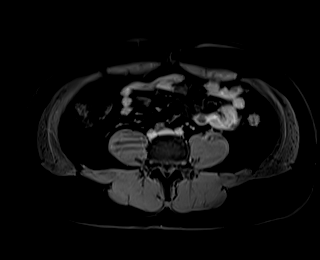
[im 48/96]
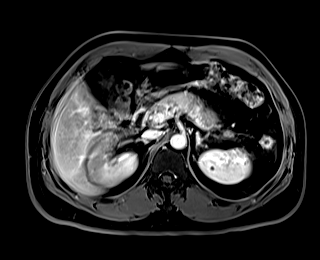
[im 96/96]
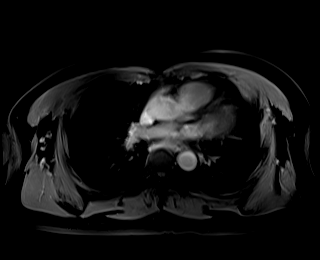

[Series 24: T1 dynamic · coronal · 4.0mm · 1.41mm/px · 2 of 60 slices shown (5 of 7)]
[im 1/60]
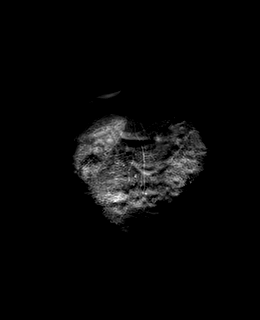
[im 60/60]
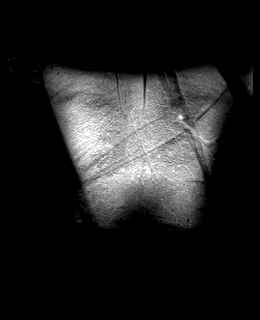

[Series 25: T2 · axial · 6.0mm · 1.45mm/px · 1 of 40 slices shown (2 of 2)]
[im 1/40]
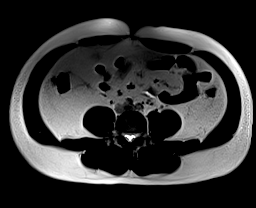

[Series 27: T1 dynamic · axial · 3.0mm · 1.19mm/px · z∈[-120,+165]mm · 3 of 96 slices shown (6 of 7)]
[im 1/96]
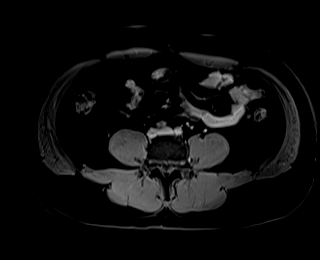
[im 48/96]
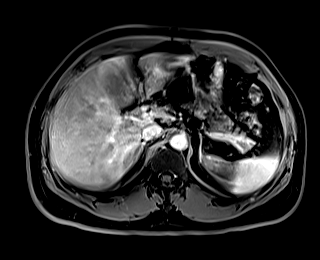
[im 96/96]
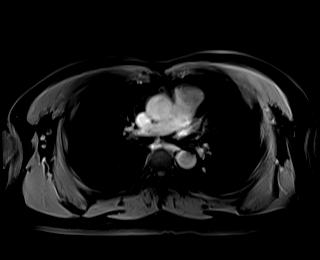

[Series 29: T1 dynamic · coronal · 4.0mm · 1.41mm/px · 2 of 60 slices shown (7 of 7)]
[im 1/60]
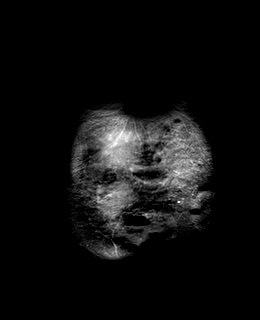
[im 60/60]
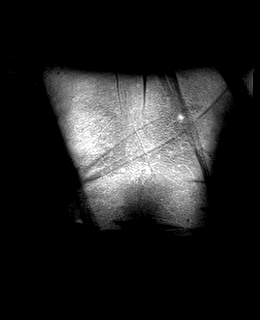

[Series 100: sub 20 sec · axial · 3.0mm · 1.19mm/px · z∈[-120,+165]mm · 3 of 96 slices shown]
[im 1/96]
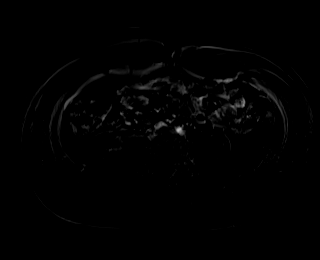
[im 48/96]
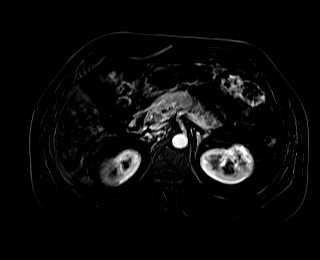
[im 96/96]
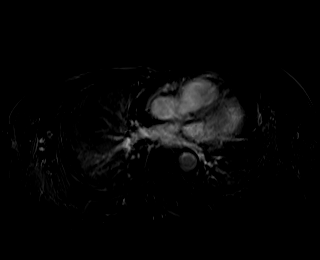

[Series 101: sub 45 sec · axial · 3.0mm · 1.19mm/px · z∈[-120,+165]mm · 3 of 96 slices shown]
[im 1/96]
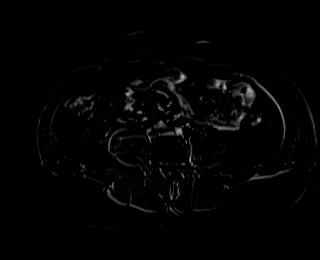
[im 48/96]
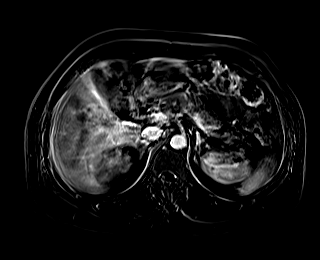
[im 96/96]
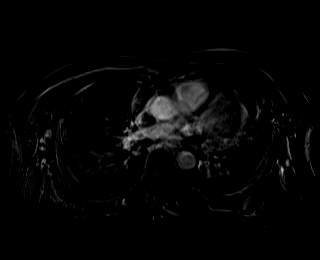

[Series 102: sub 90 sec · axial · 3.0mm · 1.19mm/px · z∈[-120,+165]mm · 3 of 96 slices shown]
[im 1/96]
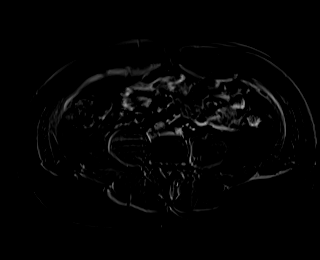
[im 48/96]
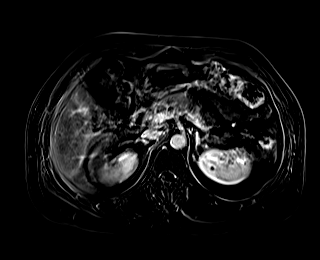
[im 96/96]
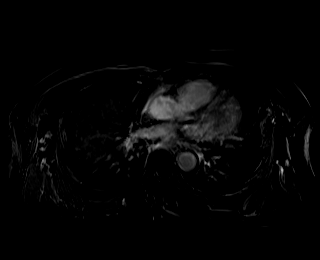

[Series 103: sub 3 min · axial · 3.0mm · 1.19mm/px · z∈[-120,+165]mm · 3 of 96 slices shown]
[im 1/96]
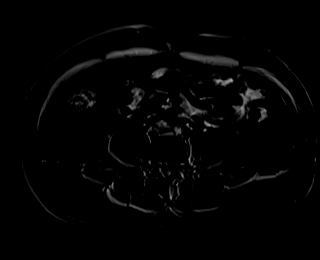
[im 48/96]
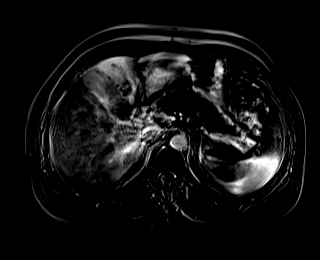
[im 96/96]
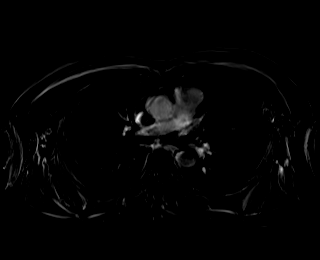

[48 of 48 positions shown; findings below may reference images not displayed]

FINDINGS: Moderate motion degradation throughout. The patient had difficulty
with breath holds, secondary to language barrier.

Lower chest: Normal heart size without pericardial or pleural
effusion.

Hepatobiliary: No cirrhosis. Probable hepatic steatosis,
suboptimally evaluated. No T2 or diffusion-weighted correlate for
the ultrasound abnormality. Postcontrast dynamic images are
significantly motion degraded, without gross abnormality.

On some series, there is gallbladder wall irregularity, including at
4 mm on 57/8 and 23/25. No acute cholecystitis or biliary duct
dilatation.

Pancreas:  Normal, without mass or ductal dilatation.

Spleen:  Normal in size, without focal abnormality.

Adrenals/Urinary Tract: Normal adrenal glands. Normal right kidney.
Tiny upper pole left renal T2 hyperintense lesion is likely a cyst
at 4 mm. No hydronephrosis.

Stomach/Bowel: Normal stomach and abdominal bowel loops.

Vascular/Lymphatic: Normal aortic caliber. No abdominal adenopathy.

Other:  No ascites.

Musculoskeletal: No acute osseous abnormality.
IMPRESSION: 1. Moderate motion degradation, as detailed above.
2. Probable hepatic steatosis. Given above limitations, no
suspicious the liver lesion identified. Given appearance/location on
ultrasound, and absence of worrisome finding on the current exam,
focal sparing of steatosis is strongly favored.
3. Subtle gallbladder wall irregularity, likely corresponding to
small polyps detailed on ultrasound.
4. No biliary duct dilatation of the explanation for elevated liver
function tests.

## 2023-11-26 DIAGNOSIS — I781 Nevus, non-neoplastic: Secondary | ICD-10-CM | POA: Diagnosis not present

## 2023-11-26 DIAGNOSIS — L821 Other seborrheic keratosis: Secondary | ICD-10-CM | POA: Diagnosis not present

## 2024-01-22 ENCOUNTER — Other Ambulatory Visit (INDEPENDENT_AMBULATORY_CARE_PROVIDER_SITE_OTHER)

## 2024-01-22 ENCOUNTER — Encounter: Payer: Self-pay | Admitting: Gastroenterology

## 2024-01-22 ENCOUNTER — Ambulatory Visit: Admitting: Gastroenterology

## 2024-01-22 VITALS — BP 120/60 | HR 72 | Ht 65.5 in | Wt 172.0 lb

## 2024-01-22 DIAGNOSIS — K580 Irritable bowel syndrome with diarrhea: Secondary | ICD-10-CM

## 2024-01-22 DIAGNOSIS — K648 Other hemorrhoids: Secondary | ICD-10-CM

## 2024-01-22 DIAGNOSIS — K449 Diaphragmatic hernia without obstruction or gangrene: Secondary | ICD-10-CM

## 2024-01-22 DIAGNOSIS — R194 Change in bowel habit: Secondary | ICD-10-CM

## 2024-01-22 DIAGNOSIS — K2100 Gastro-esophageal reflux disease with esophagitis, without bleeding: Secondary | ICD-10-CM

## 2024-01-22 DIAGNOSIS — K7581 Nonalcoholic steatohepatitis (NASH): Secondary | ICD-10-CM

## 2024-01-22 DIAGNOSIS — R7989 Other specified abnormal findings of blood chemistry: Secondary | ICD-10-CM

## 2024-01-22 LAB — IBC + FERRITIN
Ferritin: 106.3 ng/mL (ref 22.0–322.0)
Iron: 139 ug/dL (ref 42–165)
Saturation Ratios: 30.3 % (ref 20.0–50.0)
TIBC: 459.2 ug/dL — ABNORMAL HIGH (ref 250.0–450.0)
Transferrin: 328 mg/dL (ref 212.0–360.0)

## 2024-01-22 LAB — HEPATIC FUNCTION PANEL
ALT: 64 U/L — ABNORMAL HIGH (ref 0–53)
AST: 35 U/L (ref 0–37)
Albumin: 4.8 g/dL (ref 3.5–5.2)
Alkaline Phosphatase: 52 U/L (ref 39–117)
Bilirubin, Direct: 0.1 mg/dL (ref 0.0–0.3)
Total Bilirubin: 0.7 mg/dL (ref 0.2–1.2)
Total Protein: 7.5 g/dL (ref 6.0–8.3)

## 2024-01-22 LAB — PROTIME-INR
INR: 1 ratio (ref 0.8–1.0)
Prothrombin Time: 11 s (ref 9.6–13.1)

## 2024-01-22 MED ORDER — OMEPRAZOLE 20 MG PO CPDR: 20.0000 mg | DELAYED_RELEASE_CAPSULE | Freq: Every day | ORAL | 1 refills | Status: DC

## 2024-01-22 MED ORDER — HYOSCYAMINE SULFATE 0.125 MG SL SUBL: 0.1250 mg | SUBLINGUAL_TABLET | Freq: Two times a day (BID) | SUBLINGUAL | 2 refills | Status: DC

## 2024-01-22 NOTE — Progress Notes (Signed)
 [facility] Consult Note:  History: Jared Galvan 01/22/2024  Referring provider: Patient, No Pcp Per  Reason for consult/chief complaint: Rectal Bleeding (Only with spicy and greasy foods, onset in 2023 . Has colon recall for 08/2025. )   Subjective  Prior history: Clinic consult with Dr. Dominic Friendly October 2021 with following impression and plan: Elevated transaminases that may be from severe fatty liver and possibly with steatohepatitis with secondary iron overload, though autoimmune condition should be considered.  No identified HFE mutations, must still consider hemochromatosis/primary iron overload.(Patient had been undergoing phlebotomy and referred from hematology) The initial ultrasound suggested a liver lesion, though none seen on MRI with limitation of some motion degradation artifact.  Suspected to be focal fatty sparing.  aFP normal. Negative for hepatitis B and C virus.  Liver biopsy ordered -showed steatohepatitis (1/3) and minimal fibrosis(0-1/4) Was referred to [facility] for diet and lifestyle measures related to fatty liver, but he declined.  Colonoscopy December 2021 complete exam to cecum, good prep, 3-4 subcentimeter tubular adenomas without HGD.  Internal hemorrhoids noted.  5-year recall recommended.  EGD December 2021 -grade a esophagitis, no stricture, 1 to 2 cm hiatal hernia.  He had respiratory distress from secretions and coughing (see report).   Discussed the use of AI scribe software for clinical note transcription with the patient, who gave verbal consent to proceed.  History of Present Illness   [name] was accompanied by his wife today and a bit to me his interpreter.  This visit took some extra time since there was a late arrival, an interpreter is needed, and he wished to address multiple issues today.  He says that even before I last saw him he has been bothered by frequently needing a bowel movement soon  after a meal, often with some urgency.  With that urgency he may sit for a bowel movement and then feels if nothing happens, so he then strains and this is usually when it will precipitate some rectal bleeding.  He notices the bleeding occurring more often if he has spicy or greasy foods.  He also describes often feeling food or liquid come back up into the chest and then as if it may just sit there.  This may or may not be accompanied by heartburn.  He has not taken any antacid medicine regularly.  There are also some specific food triggers for those symptoms. He denies dysphagia odynophagia loss of appetite or weight loss.  He does not think his weight is appreciably changed since his last visit here (though he is noted to be down 9 pounds on our scale).  I brought up the issue of his previous fatty liver diagnosis and asked whether he had had any follow-up labs with his primary care provider.  He and his wife then began to tell me the story of his fatty liver diagnosis, though I then clarified his previous history of how he had been sent to us  by the hematologist and I ordered his liver biopsy. It sounds like he has a healthcare provider that he believes to be a nurse working at his place of employment doing annual visits with him and he does get some blood work but does not know the results.   ROS:  Review of Systems  Constitutional:  Negative for appetite change and unexpected weight change.  HENT:  Negative for mouth sores and voice change.   Eyes:  Negative for pain and redness.  Respiratory:  Negative for cough and shortness of breath.   Cardiovascular:  Negative for chest pain and palpitations.  Genitourinary:  Negative for dysuria and hematuria.  Musculoskeletal:  Negative for arthralgias and myalgias.  Skin:  Negative for pallor and rash.  Neurological:  Negative for weakness and headaches.  Hematological:  Negative for adenopathy.     Past Medical History: Past Medical History:   Diagnosis Date   Allergic rhinitis    Hyperlipidemia    Microcytic anemia      Past Surgical History: Past Surgical History:  Procedure Laterality Date   COLONOSCOPY     ESOPHAGOGASTRODUODENOSCOPY       Family History: Family History  Problem Relation Age of Onset   Uterine cancer Mother 64   Colon cancer Neg Hx    Esophageal cancer Neg Hx    Rectal cancer Neg Hx    Stomach cancer Neg Hx     Social History: Social History   Socioeconomic History   Marital status: Married    Spouse name: Not on file   Number of children: 1   Years of education: 12   Highest education level: Not on file  Occupational History   Occupation: Engineer, agricultural  Tobacco Use   Smoking status: Never   Smokeless tobacco: Never  Vaping Use   Vaping status: Never Used  Substance and Sexual Activity   Alcohol use: No   Drug use: No   Sexual activity: Yes    Partners: Female  Other Topics Concern   Not on file  Social History Narrative   From [place].  Came to the [place]  in [place]. Lives with his wife and their daughter.  His brother also lives with them.   Social Drivers of Corporate investment banker Strain: Not on file  Food Insecurity: Not on file  Transportation Needs: Not on file  Physical Activity: Not on file  Stress: Not on file  Social Connections: Not on file    Allergies: No Known Allergies  Outpatient Meds: Current Outpatient Medications  Medication Sig Dispense Refill   hyoscyamine (LEVSIN SL) 0.125 MG SL tablet Place 1 tablet (0.125 mg total) under the tongue 2 (two) times daily after a meal. 60 tablet 2   omeprazole  (PRILOSEC) 20 MG capsule Take 1 capsule (20 mg total) by mouth daily. 90 capsule 1   rosuvastatin (CRESTOR) 5 MG tablet Take 5 mg by mouth daily.     No current facility-administered medications for this visit.    The omeprazole  noted above and the Levsin were both prescribed at the time of the visit and not on his original medication list.  The only thing he  was taking was Crestor 5 mg daily for high cholesterol  ___________________________________________________________________ Objective   Exam:  BP 120/60   Pulse 72   Ht 5' 5.5" (1.664 m)   Wt 172 lb (78 kg)   BMI 28.19 kg/m  Wt Readings from Last 3 Encounters:  01/22/24 172 lb (78 kg)  02/04/21 181 lb 6.4 oz (82.3 kg)  08/25/20 180 lb (81.6 kg)    General: Well-appearing, normal vocal quality Eyes: sclera anicteric, no redness ENT: oral mucosa moist without lesions, no cervical or supraclavicular lymphadenopathy CV: Regular without appreciable murmur, no JVD, no peripheral edema Resp: clear to auscultation bilaterally, normal RR and effort noted GI: soft, no tenderness, with active bowel sounds.  Liver edge smooth nontender felt on deep inspiration Skin; warm and dry, no rash or jaundice noted.  No spider nevi  Neuro:  awake, alert and oriented x 3. Normal gross motor function and fluent speech Steady gait  Labs:  A. LIVER, RIGHT LOBE, BIOPSY: - Mildly active steatohepatitis (grade 1 of 3). See comment - Minimal patchy fibrosis (stage 0-1 of 4)    COMMENT:  The biopsy is adequate for review and consists of several cores of liver with some lobular disarray. There is mild micro- and macrovesicular steatosis (approximately 15%) with focal ballooning degeneration. Hepatic lobules show rare foci of necroinflammation. Few glycogenated nuclei are identified. Portal tracts show patchy mild mixed inflammation composed of lymphocytes, few plasma cells and scattered eosinophils. Mild ductular reaction is present in some portal tracts. Trichrome stain shows patchy centrilobular and focal portal fibrosis.  It is noted that there are some apparent thinned and delicate fibrous septa, raising the possibility of regression from a more advanced fibrosis.  Reticulin stain shows areas with lobular disarray.  Iron stain shows no abnormal iron accumulation. PASD stain shows few  ceroid-laden macrophages within sinusoids and portal tracts but does not show any globular inclusions within hepatocytes. The findings are that of mildly active steatohepatitis, alcoholic and/or non-alcoholic, with minimal fibrosis and suggestion of possible regression.    No labs in this EHR since 2022     Encounter Diagnoses  Name Primary?   Gastroesophageal reflux disease with esophagitis without hemorrhage Yes   Bleeding internal hemorrhoids    Altered bowel habits    Metabolic dysfunction-associated steatohepatitis (MASH)    Irritable bowel syndrome with diarrhea    LFTs abnormal    Hiatal hernia     Assessment and Plan Assessment & Plan  He is having symptoms of GERD which coincides with previous endoscopic findings, and probably exacerbated by the known small hiatal hernia. We discussed the limitation of acid suppression therapy, breadth of diet and lifestyle changes required for reflux control, the possibility of reflux related complications such as esophagitis, stricture or Barrett's esophagus.  Other anatomic considerations such as gastric outlet obstruction or hiatal hernia may contribute to reflux. It was somewhat difficult to know how much of this information he understood, even with the use of the interpreter.  He was given some GERD related diet and lifestyle information and [place] with his AVS today. Starting omeprazole  20 mg daily, though I did explain that acid suppression medicines have a role and their limitations.  His postprandial urgency pattern is typical for IBS, and it sounds like his stool is of variable form as well, though that was difficult to discern.  This is precipitating some hemorrhoidal bleeding. Recommended to go show I would escalated food trigger information and I suggested we do brief trial of a lactose-free diet. Trial of Levsin 0.125 mg twice daily before breakfast and supper.  Regarding his known MASH, I explained how this must be  followed regularly with labs and how he might also need a referral to hepatology.  Since I last saw him, there are new treatment options for such patients, especially where there was inflammation and fibrosis on the biopsy. Labs today: Hepatic function panel, INR, iron levels  Follow-up with me in 2 months    42 minutes were spent on this encounter, including in depth chart review, independent review of results as outlined above, communicating results with the patient directly, face-to-face time with the patient, coordinating care, ordering studies and medications as appropriate, and documentation.   Kerby Pearson III  CC: Referring provider noted above

## 2024-01-22 NOTE — Patient Instructions (Signed)
 Your provider has requested that you go to the basement level for lab work before leaving today. Press "B" on the elevator. The lab is located at the first door on the left as you exit the elevator.  We have sent the following medications to your pharmacy for you to pick up at your convenience: Levsin, Omeprazole    B?nh tro ng??c d? dy th?c qu?n (GERD) ? ng??i l?n: Nh?ng ?i?u c?n bi?t GERD in Adults: What to Know  Tro ng??c d? dy th?c qu?n (GER) x?y ra khi axit t? d? dy tro ln th?c qu?n. Th?c qu?n l ph?n c? th? ??a th?c ?n t? mi?ng xu?ng d? dy. Thng th??ng, th?c ?n ?i xu?ng v ? trong d? dy ?? ???c tiu ha. Nh?ng khi b? GER, th?c ?n v axit d? dy c th? tro ng??c. Qu v? c th? b? b?nh g?i l b?nh tro ng??c d? dy th?c qu?n (GERD) n?u tro ng??c: Xu?t hi?n th??ng xuyn. Gy ra cc tri?u ch?ng r?t x?u. Lm cho th?c qu?n c?a qu v? b? ?au v s?ng. Theo th?i gian, GERD c th? t?o ra nh?ng l? nh? g?i l v?t lot trn l?p nim m?c th?c qu?n. C nh?ng nguyn nhn g? GERD do m?t v?n ?? v?i c? gi?a th?c qu?n v d? dy gy ra. C? ny ???c g?i l c? th?t th?c qu?n d??i (LES). Khi c? ny y?u [place]?c khng bnh th??ng, n khng ?ng nh? bnh th??ng. C ngh?a l th?c ?n v axit d? dy c th? ?i ng??c tr? l?i th?c qu?n. C? ny c th? y?u n?u: Qu v? ht thu?c Vinal?c s? d?ng cc s?n ph?m thu?c l. Qu v? ?ang c [place]. Qu v? b? m?t lo?i thot v? ???c g?i l thot v? khe honh. Qu v? dng m?t s? th?c ph?m v ?? u?ng. Nh?ng th?c ph?m v ?? u?ng ny bao g?m: R??u. C ph. S-c-la. Hnh ty. B?c h cay. ?i?u g lm t?ng nguy c?? Th?a cn. B? b?nh ?nh h??ng ??n m lin k?t c?a qu v?. Dng thu?c ch?ng vim khng steroid (NSAID), ch?ng h?n nh? ibuprofen. C cc d?u hi?u [place]?c tri?u ch?ng g? ? nng. Kh nu?t. ?au khi nu?t. C?m th?y nh? c kh?i u trong h?ng. Th?y v? ??ng trong mi?ng. H?i th? hi. C?m gic kh ch?u trong b?ng Dimitrov?c ch??ng b?ng. ? h?i. ?au ng?c. Nh?ng tnh tr?ng khc c?ng  c th? gy ?au ng?c. B?o ??m qu v? ?i khm chuyn gia ch?m [place] s?c kh?e n?u b? ?au ng?c. Th? kh kh. Th? kh kh l khi qu v? pht ra ti?ng rt m?nh khi th?, th??ng g?p nh?t l khi qu v? th? ra. [place] ko di [place]?c [place] vo ban ?m. Ch?n ?on tnh tr?ng ny nh? th? no? B?nh tro ng??c d? dy th?c qu?n (GERD) c th? ???c ch?n ?on d?a vo b?nh s? v khm th?c th?Kathlynn Pardon v? c?ng c th? ???c lm cc ki?m tra. Nh?ng ki?m tra ny c th? bao g?m: N?i soi. Vi?c ki?m tra ny ?? xem xt d? dy v th?c qu?n b?ng m?t my ?nh nh?. Th? nghi?m nu?t bari. Th? nghi?m ny cho th?y hnh d?ng v kch th??c th?c qu?n v n ?ang [place]?t ??ng ra sao. Ki?m tra th?c qu?n ?? bi?t: N?ng ?? axit. C?m gic t?c n?ng. Tnh tr?ng ny ???c ?i?u tr? nh? th? no? Cch ?i?u tr? c th? ty thu?c vo m?c ?? n?ng c?a tri?u ch?ng. Vi?c ny c th? bao g?m: Thay ??i  l?i s?ng hng ngy v ch? ?? ?n. Thu?c. Ph?u thu?t. Tun th? nh?ng h??ng d?n ny ? nh: ?n v u?ng Tun th? k? [place]?ch ?n u?ng theo ch? d?n c?a chuyn gia ch?m [place] s?c kh?e c?a qu v?. Qu v? c th? c?n trnh m?t s? lo?i ?? ?n v ?? u?ng nh?t ??nh. Nh?ng lo?i ?? ?n v ?? u?ng ny c th? bao g?m: C ph v tr, c Teat?c khng c caffeine. R??u. ?? u?ng t?ng l?c v ?? u?ng dng trong th? thao. ?? u?ng c ga [place]?c soda. S-c-la v cacao. B?c h v h??ng b?c h. T?i v hnh. C?i ng?a. Th?c ph?m cay v c tnh axit. Nh?ng lo?i th?c ph?m ny bao g?m: ?t ng?t. B?t ?t v b?t c ri. Gi?m. N??c s?t nng v n??c s?t BBQ. Tri cy v n??c p tri cy h? cam qut. Nh?ng tri cy v n??c p ny bao g?m: Cam. Chanh. Chanh ta. Th?c ph?m lm t? c chua. Nh?ng th?c ph?m ny bao g?m: N??c s?t ?? v pizza km n??c s?t ??. ?t. Salsa. Cc lo?i th?c ?n chin xo v nhi?u ch?t bo. Nh?ng th?c ?n ny bao g?m: Bnh rn. Khoai ty chin. Khoai ty lt m?ng. N??c x?t nhi?u ch?t bo. Th?t nhi?u ch?t bo. Nh?ng lo?i th?t ny bao g?m: Xc xch. Bt t?t Rib eye. Th?t xng khi. Cc m?t  hng s?a giu ch?t bo. Nh?ng m?t hng ny bao g?m: S?a nguyn kem. B?. Pho mt kem. ?n cc b?a nh? th??ng xuyn. Trnh ?n nhi?u th?c ?n. Trnh u?ng nhi?u n??c trong lc ?n. Trnh ?n trong kho?ng 2-3 gi? tr??c khi ?i ng?. C? g?ng khng n?m xu?ng ngay sau khi ?n. Khng t?p th? d?c ngay sau khi ?n. L?i s?ng  N?u qu v? th?a cn, hy gi?m cn v? m?c c l?i cho s?c kh?e c?a qu v?. Hy h?i chuyn gia ch?m [place] s?c kh?e v? m?c tiu gi?m cn an ton. Khng ht thu?c, thu?c l ?i?n t? [place]?c s? d?ng nicotine Handy?c thu?c l. M?c qu?n o r?ng ri. Khng ?eo nh?ng ?? ch?t quanh th?t l?ng. Khi qu v? ng?, hy th?: Nng ??u gi??ng thm kho?ng 6 inch (15 cm). Qu v? c th? s? d?ng nm ?? lm vi?c ny. N?m nghing sang bn tri. C? g?ng lm gi?m c?ng th?ng. N?u qu v? c?n gip lm gi?m c?ng th?ng, hy h?i chuyn gia ch?m [place] cu?a qu v?. H??ng d?n chung Ch? dng thu?c theo ch? d?n. Khng dng aspirin [place]?c ibuprofen tr? khi qu v? ???c ch? d?n. Theo di b?t k? thay ??i no v? tri?u ch?ng c?a qu v?. Khng g?p ng??i n?u vi?c ny khi?n cc tri?u ch?ng tr? nn tr?m tr?ng h?n. Hy lin l?c v?i chuyn gia ch?m [place] s?c kh?e n?u: Qu v? c cc tri?u ch?ng m?i. Qu v? kh: U?ng. Nu?t. ?n. B? ?au khi nu?t. Qu v? b? th? kh kh. Qu v? b? [place] m khng ??. Qu v? b? kh?n gi?ng. Cc tri?u ch?ng c?a qu v? khng ?? h?n sau khi ?i?u tr?Wilhelmenia Harada c?u tr? gip ngay l?p t?c n?u: ??t nhin qu v? b? ?au ?: Cnh tay. C?. Hm. R?ng. L?ng. Qu v? ??t nhin c?m th?y ?? m? hi, chng m?t [place]?c chong vng. Qu v? b? ng?t. Qu v? b? ?au ng?c [place]?c b? kh th?. Qu v? nn v ch?t nn c mu: Xanh l cy, vng Lopp?c ?en. Trng gi?ng nh? ma?u Yglesias??c nh? b c ph. Phn qu  v? c mu, mu ?? Knepp?c mu ?en. Nh?ng tri?u ch?ng ny c th? l tr??ng h?p c?p c?u. G?i 911 ngay l?p t?c. Khng ch? xem tri?u ch?ng c h?t khng. Khng t? li xe ??n b?nh vi?n. Thng tin ny khng nh?m m?c ?ch thay th? cho l?i khuyn m chuyn gia  ch?m [place] s?c kh?e ni v?i qu v?. Hy b?o ??m qu v? ph?i th?o lu?n b?t k? v?n ?? g m qu v? c v?i chuyn gia ch?m [place] s?c kh?e c?a qu v?. Document Revised: 02/23/2023 Document Reviewed: 02/23/2023 Elsevier Patient Education  2024 Elsevier Inc.  Thank you for choosing me and [facility].  Dr. Lorella Roles

## 2024-01-23 ENCOUNTER — Other Ambulatory Visit: Payer: Self-pay

## 2024-01-23 ENCOUNTER — Telehealth: Payer: Self-pay | Admitting: Gastroenterology

## 2024-01-23 DIAGNOSIS — R7989 Other specified abnormal findings of blood chemistry: Secondary | ICD-10-CM

## 2024-01-23 DIAGNOSIS — K7581 Nonalcoholic steatohepatitis (NASH): Secondary | ICD-10-CM

## 2024-01-23 NOTE — Telephone Encounter (Signed)
 Spoke with the patient. Referral to hepatology

## 2024-01-23 NOTE — Telephone Encounter (Signed)
 PT called to discuss lab results. Please advise.

## 2024-01-25 NOTE — Telephone Encounter (Signed)
 Pt has appt with [facility] on 05/09/24 @ 9:15 am.

## 2024-05-09 ENCOUNTER — Other Ambulatory Visit: Payer: Self-pay | Admitting: Nurse Practitioner

## 2024-05-09 ENCOUNTER — Encounter: Payer: Self-pay | Admitting: Nurse Practitioner

## 2024-05-09 DIAGNOSIS — K7581 Nonalcoholic steatohepatitis (NASH): Secondary | ICD-10-CM | POA: Diagnosis not present

## 2024-05-13 ENCOUNTER — Ambulatory Visit
Admission: RE | Admit: 2024-05-13 | Discharge: 2024-05-13 | Disposition: A | Discharge status: Home / Self Care | Source: Ambulatory Visit | Attending: Nurse Practitioner | Admitting: Nurse Practitioner

## 2024-05-13 DIAGNOSIS — K760 Fatty (change of) liver, not elsewhere classified: Secondary | ICD-10-CM | POA: Diagnosis not present

## 2024-05-13 DIAGNOSIS — K824 Cholesterolosis of gallbladder: Secondary | ICD-10-CM | POA: Diagnosis not present

## 2024-05-13 DIAGNOSIS — K7581 Nonalcoholic steatohepatitis (NASH): Secondary | ICD-10-CM

## 2024-05-20 DIAGNOSIS — L821 Other seborrheic keratosis: Secondary | ICD-10-CM | POA: Diagnosis not present

## 2024-05-20 DIAGNOSIS — D1801 Hemangioma of skin and subcutaneous tissue: Secondary | ICD-10-CM | POA: Diagnosis not present

## 2024-07-29 ENCOUNTER — Ambulatory Visit: Admitting: Physician Assistant

## 2024-07-29 ENCOUNTER — Encounter: Payer: Self-pay | Admitting: Physician Assistant

## 2024-07-29 ENCOUNTER — Other Ambulatory Visit (INDEPENDENT_AMBULATORY_CARE_PROVIDER_SITE_OTHER)

## 2024-07-29 VITALS — BP 116/60 | HR 73 | Ht 65.0 in | Wt 174.0 lb

## 2024-07-29 DIAGNOSIS — K602 Anal fissure, unspecified: Secondary | ICD-10-CM | POA: Diagnosis not present

## 2024-07-29 DIAGNOSIS — K6289 Other specified diseases of anus and rectum: Secondary | ICD-10-CM | POA: Diagnosis not present

## 2024-07-29 DIAGNOSIS — K642 Third degree hemorrhoids: Secondary | ICD-10-CM

## 2024-07-29 DIAGNOSIS — K2100 Gastro-esophageal reflux disease with esophagitis, without bleeding: Secondary | ICD-10-CM | POA: Diagnosis not present

## 2024-07-29 DIAGNOSIS — K625 Hemorrhage of anus and rectum: Secondary | ICD-10-CM

## 2024-07-29 DIAGNOSIS — K7581 Nonalcoholic steatohepatitis (NASH): Secondary | ICD-10-CM | POA: Diagnosis not present

## 2024-07-29 LAB — CBC WITH DIFFERENTIAL/PLATELET
Basophils Absolute: 0 K/uL (ref 0.0–0.1)
Basophils Relative: 0.5 % (ref 0.0–3.0)
Eosinophils Absolute: 0.2 K/uL (ref 0.0–0.7)
Eosinophils Relative: 3.5 % (ref 0.0–5.0)
HCT: 42.4 % (ref 39.0–52.0)
Hemoglobin: 14 g/dL (ref 13.0–17.0)
Lymphocytes Relative: 50.6 % — ABNORMAL HIGH (ref 12.0–46.0)
Lymphs Abs: 3.5 K/uL (ref 0.7–4.0)
MCHC: 33.1 g/dL (ref 30.0–36.0)
MCV: 77.8 fl — ABNORMAL LOW (ref 78.0–100.0)
Monocytes Absolute: 0.5 K/uL (ref 0.1–1.0)
Monocytes Relative: 7.8 % (ref 3.0–12.0)
Neutro Abs: 2.6 K/uL (ref 1.4–7.7)
Neutrophils Relative %: 37.6 % — ABNORMAL LOW (ref 43.0–77.0)
Platelets: 221 K/uL (ref 150.0–400.0)
RBC: 5.46 Mil/uL (ref 4.22–5.81)
RDW: 14.1 % (ref 11.5–15.5)
WBC: 6.9 K/uL (ref 4.0–10.5)

## 2024-07-29 MED ORDER — OMEPRAZOLE 40 MG PO CPDR: 40.0000 mg | DELAYED_RELEASE_CAPSULE | Freq: Every day | ORAL | 3 refills | Status: DC

## 2024-07-29 MED ORDER — HYOSCYAMINE SULFATE 0.125 MG SL SUBL: 0.1250 mg | SUBLINGUAL_TABLET | Freq: Two times a day (BID) | SUBLINGUAL | 2 refills | Status: AC

## 2024-07-29 MED ORDER — HYDROCORTISONE ACETATE 25 MG RE SUPP: 25.0000 mg | Freq: Two times a day (BID) | RECTAL | 0 refills | Status: DC

## 2024-07-29 MED ORDER — HYDROCORTISONE (PERIANAL) 2.5 % EX CREA: 1.0000 | TOPICAL_CREAM | Freq: Two times a day (BID) | CUTANEOUS | 1 refills | Status: DC

## 2024-07-29 NOTE — Patient Instructions (Addendum)
 _______________________________________________________  If your blood pressure at your visit was 140/90 or greater, please contact your primary care physician to follow up on this.  _______________________________________________________  If you are age 53 or older, your body mass index should be between 23-30. Your Body mass index is 28.96 kg/m. If this is out of the aforementioned range listed, please consider follow up with your Primary Care Provider.  If you are age 15 or younger, your body mass index should be between 19-25. Your Body mass index is 28.96 kg/m. If this is out of the aformentioned range listed, please consider follow up with your Primary Care Provider.   ________________________________________________________  The [facility] providers would like to encourage you to use MYCHART to communicate with providers for non-urgent requests or questions.  Due to long hold times on the telephone, sending your provider a message by [facility] may be a faster and more efficient way to get a response.  Please allow 48 business hours for a response.  Please remember that this is for non-urgent requests.  _______________________________________________________  [facility] is using a team-based approach to care.  Your team is made up of your doctor and two to three APPS. Our APPS (Nurse Practitioners and Physician Assistants) work with your physician to ensure care continuity for you. They are fully qualified to address your health concerns and develop a treatment plan. They communicate directly with your gastroenterologist to care for you. Seeing the Advanced Practice Practitioners on your physician's team can help you by facilitating care more promptly, often allowing for earlier appointments, access to diagnostic testing, procedures, and other specialty referrals.     Nh cung c?p d?ch v? ? yu c?u b?n xu?ng t?ng h?m ?? lm xt nghi?m tr??c khi r?i ?i hm nay. Nh?n nt B  trn thang my. Phng xt nghi?m n?m ? c?a ??u tin bn tri khi b?n ra kh?i thang my.  Your provider has requested that you go to the basement level for lab work before leaving today. Press B on the elevator. The lab is located at the first door on the left as you exit the elevator.   Hy g?i cho chng ti n?u b?n khng th?y kh h?n  Please call us  if you aren't getting any better  Chng ti ? g?i cc lo?i thu?c sau ??n nh thu?c c?a b?n ?? b?n c th? ??n l?y b?t c? lc no: Thu?c ??t Hydrocortisone dng 2 l?n m?i ngy trong 14 ngy  Omeprazole  40 mg, m?i ngy m?t l?n  We have sent the following medications to your pharmacy for you to pick up at your convenience: Hydrocortisone Suppositories  apply 2 times day for 14 days  Omeprazole  40 mg once daily   Thank you for choosing me and [facility].  Delon Failing, PA-C

## 2024-07-29 NOTE — Progress Notes (Signed)
 Chief Complaint: Blood in stool  HPI:    Jared Galvan is a 53 year old Vietnamese male with a past medical history as listed below, known to Dr. Legrand, who presents to clinic today with complaint of blood in stool.    08/25/2020 colonoscopy with four 3-5 mm polyps in the rectum, sigmoid colon, descending colon and cecum as well as internal hemorrhoids.  Pathology showed tubular adenomas and repeat recommended in 5 years.    08/25/2020 EGD for dysphagia abdominal bloating with LA grade a reflux esophagitis and a 1 to 2 cm hiatal hernia.  Started on Omeprazole  20 mg p.o. daily for 4 weeks.    07/08/2020 ultrasound biopsy of the liver.  Biopsy showed mildly active steatohepatitis grade 1 of 3.  Patchy fibrosis stage 0-I/IV.  At that time Dr. Legrand recommended him to the healthy weight and wellness clinic and sent to referral.   4 /29/25 patient seen in clinic for follow-up.  At that time noted that he had severe fatty liver and possibly steatohepatitis with secondary iron overload.    05/09/2024 patient saw Dawn at the liver clinic.  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  FibroScan performed 05/09/2024 with F2 fibrosis.  At that time updated ferritin and transferrin sats.  Mended abdominal ultrasound.    05/09/2024 ferritin and transferrin normal.  LFTs and ALT of 61 otherwise normal.  CBC normal.    05/13/2024 abdominal ultrasound done for MASH with gallbladder polyps, and with no significant change since prior exam and fatty infiltration of the liver.  At that time discussed symptoms of GERD which coincide with his previous endoscopic findings and possibly exacerbated by known small hiatal hernia.  He was given reflux diet and lifestyle information.  Started on Omeprazole  20 mg daily.  Discussed IBS given postprandial urgency.  Trial of Levsin  0.125 mg twice daily before breakfast and supper.     Today, patient presents to clinic companied by his wife and  interpreter who assists with history.  He explains that over the past 2 weeks he has had bright red or slightly darker blood with his bowel movements every time he goes to the bathroom which is 3-4 times a day.  Describes his stools as normal and solid, some burning and irritation of his rectum, worse after a bowel movement.  He has not tried anything over-the-counter for this.    Also describes reflux symptoms apparently ran out of Omeprazole  20 mg daily for a little while and all of his symptoms came back, he restarted his Omeprazole  and it is just not working like it used to.    Briefly describes that he ran out of Levsin  as well, has noticed some increase in bowel movements since stopping this.    Denies fever, chills or weight loss.  Past Medical History:  Diagnosis Date   Allergic rhinitis    Hyperlipidemia    Microcytic anemia     Past Surgical History:  Procedure Laterality Date   COLONOSCOPY     ESOPHAGOGASTRODUODENOSCOPY      Current Outpatient Medications  Medication Sig Dispense Refill   hyoscyamine  (LEVSIN  SL) 0.125 MG SL tablet Place 1 tablet (0.125 mg total) under the tongue 2 (two) times daily after a meal. 60 tablet 2   omeprazole  (PRILOSEC) 20 MG capsule Take 1 capsule (20 mg total) by mouth daily. 90 capsule 1   rosuvastatin (CRESTOR) 5 MG tablet Take 5 mg by mouth daily.  No current facility-administered medications for this visit.    Allergies as of 07/29/2024   (No Known Allergies)    Family History  Problem Relation Age of Onset   Uterine cancer Mother 49   Colon cancer Neg Hx    Esophageal cancer Neg Hx    Rectal cancer Neg Hx    Stomach cancer Neg Hx     Social History   Socioeconomic History   Marital status: Married    Spouse name: Not on file   Number of children: 1   Years of education: 12   Highest education level: Not on file  Occupational History   Occupation: engineer, agricultural  Tobacco Use   Smoking status: Never   Smokeless tobacco:  Never  Vaping Use   Vaping status: Never Used  Substance and Sexual Activity   Alcohol use: No   Drug use: No   Sexual activity: Yes    Partners: Female  Other Topics Concern   Not on file  Social History Narrative   From [place].  Came to the [place]  in 1992. Lives with his wife and their daughter.  His brother also lives with them.   Social Drivers of Corporate Investment Banker Strain: Not on file  Food Insecurity: Low Risk  (05/09/2024)   Received from [facility]   Hunger Vital Sign    Within the past 12 months, you worried that your food would run out before you got money to buy more: Never true    Within the past 12 months, the food you bought just didn't last and you didn't have money to get more. : Never true  Transportation Needs: No Transportation Needs (05/09/2024)   Received from [facility]    In the past 12 months, has lack of reliable transportation kept you from medical appointments, meetings, work or from getting things needed for daily living? : No  Physical Activity: Not on file  Stress: Not on file  Social Connections: Not on file  Intimate Partner Violence: Not on file    Review of Systems:    Constitutional: No weight loss, fever or chills Cardiovascular: No chest pain Respiratory: No SOB Gastrointestinal: See HPI and otherwise negative   Physical Exam:  Vital signs: BP 116/60   Pulse 73   Ht 5' 5 (1.651 m)   Wt 174 lb (78.9 kg)   BMI 28.96 kg/m    Constitutional:   Pleasant Vietnamese male appears to be in NAD, Well developed, Well nourished, alert and cooperative  Respiratory: Respirations even and unlabored. Lungs clear to auscultation bilaterally.   No wheezes, crackles, or rhonchi.  Cardiovascular: Normal S1, S2. No MRG. Regular rate and rhythm. No peripheral edema, cyanosis or pallor.  Gastrointestinal:  Soft, nondistended, nontender. No rebound or guarding. Normal bowel sounds. No appreciable masses or  hepatomegaly. Rectal: External: Small posterior fissure; internal: Fullness and tight sphincter tone; anoscopy: Grade 3 ulcerated hemorrhoid (confirmed with Dr. Suzann) Psychiatric: Demonstrates good judgement and reason without abnormal affect or behaviors.  RELEVANT LABS AND IMAGING: CBC    Component Value Date/Time   WBC 8.7 02/04/2021 1107   RBC 5.61 02/04/2021 1107   HGB 14.3 02/04/2021 1107   HGB 13.1 07/16/2020 1145   HCT 43.5 02/04/2021 1107   PLT 243 02/04/2021 1107   PLT 225 07/16/2020 1145   MCV 77.5 (L) 02/04/2021 1107   MCV 76.0 (A) 09/13/2015 1455   MCH 25.5 (L) 02/04/2021 1107   MCHC 32.9 02/04/2021 1107  RDW 14.0 02/04/2021 1107   LYMPHSABS 3.6 02/04/2021 1107   MONOABS 0.5 02/04/2021 1107   EOSABS 0.2 02/04/2021 1107   BASOSABS 0.0 02/04/2021 1107    CMP     Component Value Date/Time   NA 139 02/04/2021 1107   K 4.2 02/04/2021 1107   CL 104 02/04/2021 1107   CO2 29 02/04/2021 1107   GLUCOSE 99 02/04/2021 1107   BUN 17 02/04/2021 1107   CREATININE 0.96 02/04/2021 1107   CREATININE 0.80 09/13/2015 1436   CALCIUM 9.9 02/04/2021 1107   PROT 7.5 01/22/2024 1445   ALBUMIN 4.8 01/22/2024 1445   AST 35 01/22/2024 1445   AST 53 (H) 02/04/2021 1107   ALT 64 (H) 01/22/2024 1445   ALT 95 (H) 02/04/2021 1107   ALKPHOS 52 01/22/2024 1445   BILITOT 0.7 01/22/2024 1445   BILITOT 0.6 02/04/2021 1107   GFRNONAA >60 02/04/2021 1107   GFRAA >60 06/18/2020 0902    Assessment: 1.  Rectal bleeding: Bright red and some dark red blood over the past 2 weeks with every bowel movement 3-4 times a day, increased frequency of bowel movements after running out of Levsin , some rectal discomfort, worse after a bowel movement, ulcerated internal hemorrhoid on exam today which is likely the source, confirm with Dr. Suzann 2.  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  GERD: Increased symptoms regardless of Omeprazole  20 daily, likely known esophagitis +/- hiatal  hernia  Plan: 1.  CBC and CMP today given ongoing bleeding 2.  Prescribed Hydrocortisone suppositories twice daily x 2 weeks with 1 refill. 3.  Reviewed that the patient should be keeping his stool soft with no straining. 4.  Refilled Omeprazole  but increase to 40 mg daily given some breakthrough reflux symptoms #30 with 5 refills 5.  Refill Hyoscyamine  given increased bowel movements, this seems to be working for his diagnosed IBS 6.  Patient to follow in clinic in 2 months or sooner if necessary.  If the suppositories do not help would recommend a flex sig or colonoscopy.  Jared Failing, PA-C [facility] 07/29/2024, 10:50 AM  Cc: No ref. provider found

## 2024-07-30 ENCOUNTER — Other Ambulatory Visit: Payer: Self-pay

## 2024-07-30 ENCOUNTER — Telehealth: Payer: Self-pay | Admitting: Physician Assistant

## 2024-07-30 ENCOUNTER — Ambulatory Visit: Payer: Self-pay | Admitting: Physician Assistant

## 2024-07-30 MED ORDER — HYDROCORTISONE ACETATE 25 MG RE SUPP: 25.0000 mg | Freq: Two times a day (BID) | RECTAL | 0 refills | Status: DC

## 2024-07-30 NOTE — Telephone Encounter (Signed)
 Patient will need refill for Hydrocortisone to complete 14 day course. Patient informed that refill be sent to pharmacy.

## 2024-07-30 NOTE — Telephone Encounter (Signed)
 Patient requesting f/u call in regards to medication.

## 2024-08-03 ENCOUNTER — Other Ambulatory Visit: Payer: Self-pay | Admitting: Physician Assistant

## 2024-08-03 NOTE — Progress Notes (Signed)
 ____________________________________________________________  Attending physician addendum:  Thank you for sending this case to me. I have reviewed the entire note and agree with the plan.  High-fiber diet as well. If hemorrhoidal symptoms not much improved at follow-up, schedule colonoscopy rather than sigmoidoscopy with his history of adenomatous polyps.   Victory Brand, MD  ____________________________________________________________

## 2024-08-07 ENCOUNTER — Telehealth: Payer: Self-pay | Admitting: Physician Assistant

## 2024-08-07 DIAGNOSIS — K625 Hemorrhage of anus and rectum: Secondary | ICD-10-CM

## 2024-08-07 MED ORDER — NA SULFATE-K SULFATE-MG SULF 17.5-3.13-1.6 GM/177ML PO SOLN: 1.0000 | Freq: Once | ORAL | 0 refills | Status: AC

## 2024-08-07 NOTE — Telephone Encounter (Signed)
 Pt will come in on Monday to have repeat CBC scheduled. Reminder message sent to call pt Monday morning. Scheduled pt for colonoscopy in the [facility] with Dr. Legrand for Friday 08/15/24 at 1:30 PM. Discussed colonoscopy prep instructions with pt in detail, including to pick up prep from pharmacy as soon as it is ready, when to take both doses of prep, and what to expect. Pt verbalized understanding. Advised pt to call back with any questions or concerns.   Instructions sent to pt via my chart at this time.

## 2024-08-07 NOTE — Telephone Encounter (Signed)
 Spoke with pt. He reports that he has 2 doses of the Hydrocortisone suppositories left and he is still having a lot of rectal bleeding. Reports that he occasionally feels light-headed after having a bm.

## 2024-08-07 NOTE — Telephone Encounter (Signed)
 Inbound call from patient stating he has been having significant amount of blood when having a bowel movement. Patient is requesting a call back. Please advise, thank you

## 2024-08-08 ENCOUNTER — Encounter: Payer: Self-pay | Admitting: Gastroenterology

## 2024-08-08 ENCOUNTER — Encounter: Payer: Self-pay | Admitting: Hematology

## 2024-08-11 ENCOUNTER — Other Ambulatory Visit

## 2024-08-11 ENCOUNTER — Ambulatory Visit: Payer: Self-pay | Admitting: Physician Assistant

## 2024-08-11 DIAGNOSIS — K625 Hemorrhage of anus and rectum: Secondary | ICD-10-CM | POA: Diagnosis not present

## 2024-08-11 LAB — CBC WITH DIFFERENTIAL/PLATELET
Basophils Absolute: 0 K/uL (ref 0.0–0.1)
Basophils Relative: 0.5 % (ref 0.0–3.0)
Eosinophils Absolute: 0.2 K/uL (ref 0.0–0.7)
Eosinophils Relative: 3.3 % (ref 0.0–5.0)
HCT: 42.8 % (ref 39.0–52.0)
Hemoglobin: 13.8 g/dL (ref 13.0–17.0)
Lymphocytes Relative: 50.1 % — ABNORMAL HIGH (ref 12.0–46.0)
Lymphs Abs: 3.1 K/uL (ref 0.7–4.0)
MCHC: 32.1 g/dL (ref 30.0–36.0)
MCV: 80 fl (ref 78.0–100.0)
Monocytes Absolute: 0.4 K/uL (ref 0.1–1.0)
Monocytes Relative: 6.2 % (ref 3.0–12.0)
Neutro Abs: 2.5 K/uL (ref 1.4–7.7)
Neutrophils Relative %: 39.9 % — ABNORMAL LOW (ref 43.0–77.0)
Platelets: 227 K/uL (ref 150.0–400.0)
RBC: 5.35 Mil/uL (ref 4.22–5.81)
RDW: 14.1 % (ref 11.5–15.5)
WBC: 6.1 K/uL (ref 4.0–10.5)

## 2024-08-11 NOTE — Progress Notes (Signed)
 Please call the patient and let him know his hemoglobin stable so although he is still seeing some blood I does not look like it is a large amount, just as it was at the time of his office visit.  We will see what colonoscopy shows.  Thanks, JL L

## 2024-08-15 ENCOUNTER — Encounter: Payer: Self-pay | Admitting: Gastroenterology

## 2024-08-15 ENCOUNTER — Ambulatory Visit (AMBULATORY_SURGERY_CENTER): Admitting: Gastroenterology

## 2024-08-15 VITALS — BP 125/82 | HR 65 | Temp 98.1°F | Resp 15 | Ht 65.0 in | Wt 174.0 lb

## 2024-08-15 DIAGNOSIS — K648 Other hemorrhoids: Secondary | ICD-10-CM | POA: Diagnosis not present

## 2024-08-15 DIAGNOSIS — D124 Benign neoplasm of descending colon: Secondary | ICD-10-CM | POA: Diagnosis not present

## 2024-08-15 DIAGNOSIS — K625 Hemorrhage of anus and rectum: Secondary | ICD-10-CM | POA: Diagnosis not present

## 2024-08-15 DIAGNOSIS — D122 Benign neoplasm of ascending colon: Secondary | ICD-10-CM

## 2024-08-15 DIAGNOSIS — D123 Benign neoplasm of transverse colon: Secondary | ICD-10-CM

## 2024-08-15 MED ORDER — HYDROCORTISONE ACETATE 25 MG RE SUPP: 25.0000 mg | Freq: Two times a day (BID) | RECTAL | 1 refills | Status: AC | PRN

## 2024-08-15 MED ORDER — SODIUM CHLORIDE 0.9 % IV SOLN: 500.0000 mL | Freq: Once | INTRAVENOUS | Status: DC

## 2024-08-15 NOTE — Progress Notes (Signed)
 Called to room to assist during endoscopic procedure.  Patient ID and intended procedure confirmed with present staff. Received instructions for my participation in the procedure from the performing physician.

## 2024-08-15 NOTE — Op Note (Signed)
 [facility] Patient Name: Jared Galvan Procedure Date: 08/15/2024 3:06 PM MRN: [id number] Endoscopist: Victory L. Legrand , MD, [id number] Age: 53 Referring MD:  Date of Birth: [birth year] Gender: Male Account #: [id number] Procedure:                Colonoscopy Indications:              Rectal bleeding                           clinical details in recent office note                           patient also had 4 SubCM TX on Dec 2021 colonoscopy Medicines:                Monitored Anesthesia Care Procedure:                Pre-Anesthesia Assessment:                           - Prior to the procedure, a History and Physical                            was performed, and patient medications and                            allergies were reviewed. The patient's tolerance of                            previous anesthesia was also reviewed. The risks                            and benefits of the procedure and the sedation                            options and risks were discussed with the patient.                            All questions were answered, and informed consent                            was obtained. Prior Anticoagulants: The patient has                            taken no anticoagulant or antiplatelet agents. ASA                            Grade Assessment: II - A patient with mild systemic                            disease. After reviewing the risks and benefits,                            the patient was deemed in satisfactory condition to  undergo the procedure.                           After obtaining informed consent, the colonoscope                            was passed under direct vision. Throughout the                            procedure, the patient's blood pressure, pulse, and                            oxygen saturations were monitored continuously. The                            Olympus Scope SN: [id number] was introduced through                             the anus and advanced to the the cecum, identified                            by appendiceal orifice and ileocecal valve. The                            colonoscopy was performed without difficulty. The                            patient tolerated the procedure well. The quality                            of the bowel preparation was good. The ileocecal                            valve, appendiceal orifice, and rectum were                            photographed. Scope In: 3:24:17 PM Scope Out: 3:43:07 PM Scope Withdrawal Time: 0 hours 16 minutes 42 seconds  Total Procedure Duration: 0 hours 18 minutes 50 seconds  Findings:                 The perianal and digital rectal examinations were                            normal. Hemorrhoidal plexus tissue is soft.                           Three sessile polyps were found in the descending                            colon, transverse colon and ascending colon. The                            polyps were 2 to 8 mm in size. These polyps were  removed with a cold snare. Resection and retrieval                            were complete.                           Repeat examination of right colon under NBI                            performed.                           Internal hemorrhoids were found. The hemorrhoids                            were large. The most prominent column at 3-4                            O'clock (anterior) had an ulceration.                           The exam was otherwise without abnormality on                            direct and retroflexion views. Complications:            No immediate complications. Estimated Blood Loss:     Estimated blood loss was minimal. Impression:               - Three 2 to 8 mm polyps in the descending colon,                            in the transverse colon and in the ascending colon,                            removed with a cold snare. Resected  and retrieved.                           - Internal hemorrhoids.                           - The examination was otherwise normal on direct                            and retroflexion views. Recommendation:           - Patient has a contact number available for                            emergencies. The signs and symptoms of potential                            delayed complications were discussed with the                            patient. Return to normal activities tomorrow.  Written discharge instructions were provided to the                            patient.                           - Resume previous diet.                           - Continue present medications.                           - Await pathology results.                           - Repeat colonoscopy is recommended for                            surveillance. The colonoscopy date will be                            determined after pathology results from today's                            exam become available for review.                           - Refer to a colo-rectal surgeon at appointment to                            be scheduled.(Dr. Mabel Essex)                           Size and morphology as well as ulceration of                            hemorrhoids appears to favor hemorrhoidectomy over                            banding. Khushboo Chuck L. Legrand, MD 08/15/2024 3:53:36 PM This report has been signed electronically.

## 2024-08-15 NOTE — Progress Notes (Signed)
 No significant changes to clinical history since GI office visit on 07/29/24. Evaluated before procedure with interpreter present The patient is appropriate for an endoscopic procedure in the ambulatory setting.  - Victory Brand, MD

## 2024-08-15 NOTE — Progress Notes (Signed)
 Pt's states no medical or surgical changes since previsit or office visit.

## 2024-08-15 NOTE — Progress Notes (Signed)
 Sedate, gd SR, tolerated procedure well, VSS, report to RN

## 2024-08-15 NOTE — Patient Instructions (Signed)
 Thank you for letting us  care for your healthcare needs today! Please see handouts regarding Polyps and Hemorrhoids. Resume previous diet. Continue current medications. Await pathology results. Refer to colo-rectal surgeon at appointment to be scheduled (Dr. Mabel Essex)  YOU HAD AN ENDOSCOPIC PROCEDURE TODAY AT THE [facility]:   Refer to the procedure report that was given to you for any specific questions about what was found during the examination.  If the procedure report does not answer your questions, please call your gastroenterologist to clarify.  If you requested that your care partner not be given the details of your procedure findings, then the procedure report has been included in a sealed envelope for you to review at your convenience later.  YOU SHOULD EXPECT: Some feelings of bloating in the abdomen. Passage of more gas than usual.  Walking can help get rid of the air that was put into your GI tract during the procedure and reduce the bloating. If you had a lower endoscopy (such as a colonoscopy or flexible sigmoidoscopy) you may notice spotting of blood in your stool or on the toilet paper. If you underwent a bowel prep for your procedure, you may not have a normal bowel movement for a few days.  Please Note:  You might notice some irritation and congestion in your nose or some drainage.  This is from the oxygen used during your procedure.  There is no need for concern and it should clear up in a day or so.  SYMPTOMS TO REPORT IMMEDIATELY:  Following lower endoscopy (colonoscopy or flexible sigmoidoscopy):  Excessive amounts of blood in the stool  Significant tenderness or worsening of abdominal pains  Swelling of the abdomen that is new, acute  Fever of 100F or higher  For urgent or emergent issues, a gastroenterologist can be reached at any hour by calling [phone]. Do not use MyChart messaging for urgent concerns.    DIET:  We do recommend a small  meal at first, but then you may proceed to your regular diet.  Drink plenty of fluids but you should avoid alcoholic beverages for 24 hours.  ACTIVITY:  You should plan to take it easy for the rest of today and you should NOT DRIVE or use heavy machinery until tomorrow (because of the sedation medicines used during the test).    FOLLOW UP: Our staff will call the number listed on your records the next business day following your procedure.  We will call around 7:15- 8:00 am to check on you and address any questions or concerns that you may have regarding the information given to you following your procedure. If we do not reach you, we will leave a message.     If any biopsies were taken you will be contacted by phone or by letter within the next 1-3 weeks.  Please call us  at [phone] if you have not heard about the biopsies in 3 weeks.    SIGNATURES/CONFIDENTIALITY: You and/or your care partner have signed paperwork which will be entered into your electronic medical record.  These signatures attest to the fact that that the information above on your After Visit Summary has been reviewed and is understood.  Full responsibility of the confidentiality of this discharge information lies with you and/or your care-partner.

## 2024-08-18 ENCOUNTER — Telehealth: Payer: Self-pay

## 2024-08-18 NOTE — Telephone Encounter (Signed)
 Left message on follow up call.

## 2024-08-19 ENCOUNTER — Other Ambulatory Visit: Payer: Self-pay

## 2024-08-19 DIAGNOSIS — K625 Hemorrhage of anus and rectum: Secondary | ICD-10-CM

## 2024-08-19 DIAGNOSIS — K648 Other hemorrhoids: Secondary | ICD-10-CM

## 2024-08-20 ENCOUNTER — Ambulatory Visit: Payer: Self-pay | Admitting: Gastroenterology

## 2024-08-20 LAB — SURGICAL PATHOLOGY

## 2024-08-29 DIAGNOSIS — K648 Other hemorrhoids: Secondary | ICD-10-CM | POA: Diagnosis not present

## 2024-10-06 ENCOUNTER — Telehealth: Payer: Self-pay | Admitting: Gastroenterology

## 2024-10-06 NOTE — Telephone Encounter (Signed)
 Inbound call from patient stating that he is needing to know if he should still be taking his 2 medication he was given. Patient is requesting a call back. Please advise.

## 2024-10-07 NOTE — Telephone Encounter (Signed)
 Spoke with the patient. Confirmed he can continue the omeprazole  (30 minutes before his 1st meal). Patient reports it has helped. I feel good. Levsin  is not needed every time he eats and he can take it if symptoms develop. Declines to schedule follow up appointment.

## 2024-10-21 ENCOUNTER — Other Ambulatory Visit: Payer: Self-pay

## 2024-10-21 MED ORDER — OMEPRAZOLE 40 MG PO CPDR: 40.0000 mg | DELAYED_RELEASE_CAPSULE | Freq: Every day | ORAL | 3 refills | Status: AC
